# Patient Record
Sex: Female | Born: 1975 | Race: White | Hispanic: Yes | Marital: Married | Smoking: Never smoker
Health system: Southern US, Community
[De-identification: ages and names within clinical notes are randomized; demographics above are authoritative.]

## PROBLEM LIST (undated history)

## (undated) DIAGNOSIS — Z9889 Other specified postprocedural states: Secondary | ICD-10-CM

## (undated) DIAGNOSIS — T8859XA Other complications of anesthesia, initial encounter: Secondary | ICD-10-CM

## (undated) DIAGNOSIS — L988 Other specified disorders of the skin and subcutaneous tissue: Secondary | ICD-10-CM

## (undated) DIAGNOSIS — Z8619 Personal history of other infectious and parasitic diseases: Secondary | ICD-10-CM

## (undated) DIAGNOSIS — T4145XA Adverse effect of unspecified anesthetic, initial encounter: Secondary | ICD-10-CM

## (undated) DIAGNOSIS — R112 Nausea with vomiting, unspecified: Secondary | ICD-10-CM

## (undated) HISTORY — DX: Personal history of other infectious and parasitic diseases: Z86.19

## (undated) HISTORY — DX: Other complications of anesthesia, initial encounter: T88.59XA

## (undated) HISTORY — DX: Other specified disorders of the skin and subcutaneous tissue: L98.8

## (undated) HISTORY — DX: Other specified postprocedural states: R11.2

## (undated) HISTORY — DX: Other specified postprocedural states: Z98.890

## (undated) HISTORY — DX: Adverse effect of unspecified anesthetic, initial encounter: T41.45XA

## (undated) HISTORY — PX: RECTOPERITONEAL FISTULA CLOSURE: SHX2314

---

## 2004-10-14 ENCOUNTER — Other Ambulatory Visit: Admission: RE | Admit: 2004-10-14 | Discharge: 2004-10-14 | Payer: Self-pay | Admitting: Gynecology

## 2005-11-03 ENCOUNTER — Other Ambulatory Visit: Admission: RE | Admit: 2005-11-03 | Discharge: 2005-11-03 | Payer: Self-pay | Admitting: Gynecology

## 2006-10-29 HISTORY — PX: BARTHOLIN CYST MARSUPIALIZATION: SHX5383

## 2007-08-02 ENCOUNTER — Other Ambulatory Visit: Admission: RE | Admit: 2007-08-02 | Discharge: 2007-08-02 | Payer: Self-pay | Admitting: Gynecology

## 2008-08-21 ENCOUNTER — Encounter: Payer: Self-pay | Admitting: Women's Health

## 2008-08-21 ENCOUNTER — Other Ambulatory Visit: Admission: RE | Admit: 2008-08-21 | Discharge: 2008-08-21 | Payer: Self-pay | Admitting: Gynecology

## 2008-08-21 ENCOUNTER — Ambulatory Visit: Payer: Self-pay | Admitting: Women's Health

## 2011-07-21 ENCOUNTER — Encounter: Payer: Self-pay | Admitting: Women's Health

## 2011-07-21 ENCOUNTER — Other Ambulatory Visit (HOSPITAL_COMMUNITY)
Admission: RE | Admit: 2011-07-21 | Discharge: 2011-07-21 | Disposition: A | Discharge status: Home / Self Care | Payer: PRIVATE HEALTH INSURANCE | Source: Ambulatory Visit | Attending: Obstetrics and Gynecology | Admitting: Obstetrics and Gynecology

## 2011-07-21 ENCOUNTER — Ambulatory Visit (INDEPENDENT_AMBULATORY_CARE_PROVIDER_SITE_OTHER): Payer: PRIVATE HEALTH INSURANCE | Admitting: Women's Health

## 2011-07-21 VITALS — BP 126/72 | Ht 64.0 in | Wt 132.0 lb

## 2011-07-21 DIAGNOSIS — Z833 Family history of diabetes mellitus: Secondary | ICD-10-CM

## 2011-07-21 DIAGNOSIS — E079 Disorder of thyroid, unspecified: Secondary | ICD-10-CM

## 2011-07-21 DIAGNOSIS — Z01419 Encounter for gynecological examination (general) (routine) without abnormal findings: Secondary | ICD-10-CM

## 2011-07-21 LAB — CBC WITH DIFFERENTIAL/PLATELET
Basophils Relative: 0 % (ref 0–1)
Eosinophils Absolute: 0.2 10*3/uL (ref 0.0–0.7)
Eosinophils Relative: 2 % (ref 0–5)
Lymphs Abs: 2.3 10*3/uL (ref 0.7–4.0)
MCH: 28.5 pg (ref 26.0–34.0)
MCHC: 34.6 g/dL (ref 30.0–36.0)
MCV: 82.4 fL (ref 78.0–100.0)
Neutrophils Relative %: 68 % (ref 43–77)
Platelets: 297 10*3/uL (ref 150–400)
RDW: 13 % (ref 11.5–15.5)

## 2011-07-21 LAB — TSH: TSH: 0.838 u[IU]/mL (ref 0.350–4.500)

## 2011-07-21 NOTE — Patient Instructions (Signed)
Tdap vaccine Health Maintenance, Females A healthy lifestyle and preventative care can promote health and wellness.  Maintain regular health, dental, and eye exams.   Eat a healthy diet. Foods like vegetables, fruits, whole grains, low-fat dairy products, and lean protein foods contain the nutrients you need without too many calories. Decrease your intake of foods high in solid fats, added sugars, and salt. Get information about a proper diet from your caregiver, if necessary.   Regular physical exercise is one of the most important things you can do for your health. Most adults should get at least 150 minutes of moderate-intensity exercise (any activity that increases your heart rate and causes you to sweat) each week. In addition, most adults need muscle-strengthening exercises on 2 or more days a week.    Maintain a healthy weight. The body mass index (BMI) is a screening tool to identify possible weight problems. It provides an estimate of body fat based on height and weight. Your caregiver can help determine your BMI, and can help you achieve or maintain a healthy weight. For adults 20 years and older:   A BMI below 18.5 is considered underweight.   A BMI of 18.5 to 24.9 is normal.   A BMI of 25 to 29.9 is considered overweight.   A BMI of 30 and above is considered obese.   Maintain normal blood lipids and cholesterol by exercising and minimizing your intake of saturated fat. Eat a balanced diet with plenty of fruits and vegetables. Blood tests for lipids and cholesterol should begin at age 43 and be repeated every 5 years. If your lipid or cholesterol levels are high, you are over 50, or you are a high risk for heart disease, you may need your cholesterol levels checked more frequently.Ongoing high lipid and cholesterol levels should be treated with medicines if diet and exercise are not effective.   If you smoke, find out from your caregiver how to quit. If you do not use tobacco, do  not start.   If you are pregnant, do not drink alcohol. If you are breastfeeding, be very cautious about drinking alcohol. If you are not pregnant and choose to drink alcohol, do not exceed 1 drink per day. One drink is considered to be 12 ounces (355 mL) of beer, 5 ounces (148 mL) of wine, or 1.5 ounces (44 mL) of liquor.   Avoid use of street drugs. Do not share needles with anyone. Ask for help if you need support or instructions about stopping the use of drugs.   High blood pressure causes heart disease and increases the risk of stroke. Blood pressure should be checked at least every 1 to 2 years. Ongoing high blood pressure should be treated with medicines, if weight loss and exercise are not effective.   If you are 66 to 37 years old, ask your caregiver if you should take aspirin to prevent strokes.   Diabetes screening involves taking a blood sample to check your fasting blood sugar level. This should be done once every 3 years, after age 16, if you are within normal weight and without risk factors for diabetes. Testing should be considered at a younger age or be carried out more frequently if you are overweight and have at least 1 risk factor for diabetes.   Breast cancer screening is essential preventative care for women. You should practice "breast self-awareness." This means understanding the normal appearance and feel of your breasts and may include breast self-examination. Any changes detected, no  matter how small, should be reported to a caregiver. Women in their 20s and 30s should have a clinical breast exam (CBE) by a caregiver as part of a regular health exam every 1 to 3 years. After age 40, women should have a CBE every year. Starting at age 40, women should consider having a mammogram (breast X-ray) every year. Women who have a family history of breast cancer should talk to their caregiver about genetic screening. Women at a high risk of breast cancer should talk to their caregiver  about having an MRI and a mammogram every year.   The Pap test is a screening test for cervical cancer. Women should have a Pap test starting at age 21. Between ages 21 and 29, Pap tests should be repeated every 2 years. Beginning at age 30, you should have a Pap test every 3 years as long as the past 3 Pap tests have been normal. If you had a hysterectomy for a problem that was not cancer or a condition that could lead to cancer, then you no longer need Pap tests. If you are between ages 65 and 70, and you have had normal Pap tests going back 10 years, you no longer need Pap tests. If you have had past treatment for cervical cancer or a condition that could lead to cancer, you need Pap tests and screening for cancer for at least 20 years after your treatment. If Pap tests have been discontinued, risk factors (such as a new sexual partner) need to be reassessed to determine if screening should be resumed. Some women have medical problems that increase the chance of getting cervical cancer. In these cases, your caregiver may recommend more frequent screening and Pap tests.   The human papillomavirus (HPV) test is an additional test that may be used for cervical cancer screening. The HPV test looks for the virus that can cause the cell changes on the cervix. The cells collected during the Pap test can be tested for HPV. The HPV test could be used to screen women aged 30 years and older, and should be used in women of any age who have unclear Pap test results. After the age of 30, women should have HPV testing at the same frequency as a Pap test.   Colorectal cancer can be detected and often prevented. Most routine colorectal cancer screening begins at the age of 50 and continues through age 75. However, your caregiver may recommend screening at an earlier age if you have risk factors for colon cancer. On a yearly basis, your caregiver may provide home test kits to check for hidden blood in the stool. Use of a  small camera at the end of a tube, to directly examine the colon (sigmoidoscopy or colonoscopy), can detect the earliest forms of colorectal cancer. Talk to your caregiver about this at age 50, when routine screening begins. Direct examination of the colon should be repeated every 5 to 10 years through age 75, unless early forms of pre-cancerous polyps or small growths are found.   Hepatitis C blood testing is recommended for all people born from 1945 through 1965 and any individual with known risks for hepatitis C.   Practice safe sex. Use condoms and avoid high-risk sexual practices to reduce the spread of sexually transmitted infections (STIs). Sexually active women aged 25 and younger should be checked for Chlamydia, which is a common sexually transmitted infection. Older women with new or multiple partners should also be tested for Chlamydia. Testing   for other STIs is recommended if you are sexually active and at increased risk.   Osteoporosis is a disease in which the bones lose minerals and strength with aging. This can result in serious bone fractures. The risk of osteoporosis can be identified using a bone density scan. Women ages 99 and over and women at risk for fractures or osteoporosis should discuss screening with their caregivers. Ask your caregiver whether you should be taking a calcium supplement or vitamin D to reduce the rate of osteoporosis.   Menopause can be associated with physical symptoms and risks. Hormone replacement therapy is available to decrease symptoms and risks. You should talk to your caregiver about whether hormone replacement therapy is right for you.   Use sunscreen with a sun protection factor (SPF) of 30 or greater. Apply sunscreen liberally and repeatedly throughout the day. You should seek shade when your shadow is shorter than you. Protect yourself by wearing long sleeves, pants, a wide-brimmed hat, and sunglasses year round, whenever you are outdoors.   Notify  your caregiver of new moles or changes in moles, especially if there is a change in shape or color. Also notify your caregiver if a mole is larger than the size of a pencil eraser.   Stay current with your immunizations.  Document Released: 08/29/2010 Document Revised: 02/02/2011 Document Reviewed: 08/29/2010 [facility] Patient Information 2012 [place], [place].

## 2011-07-21 NOTE — Progress Notes (Signed)
Brandi Velez 1975-04-29 [id number]    History:    The patient presents for annual exam.  Monthly 3 day cycle. 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  Exam:  Filed Vitals:   07/21/11 1427  BP: 126/72    General appearance:  Normal Head/Neck:  Normal, without cervical or supraclavicular adenopathy. Thyroid:  Symmetrical, normal in size, without palpable masses or nodularity. Respiratory  Effort:  Normal  Auscultation:  Clear without wheezing or rhonchi Cardiovascular  Auscultation:  Regular rate, without rubs, murmurs or gallops  Edema/varicosities:  Not grossly evident Abdominal  Soft,nontender, without masses, guarding or rebound.  Liver/spleen:  No organomegaly noted  Hernia:  None appreciated  Skin  Inspection:  Grossly normal  Palpation:  Grossly normal Neurologic/psychiatric  Orientation:  Normal with appropriate conversation.  Mood/affect:  Normal  Genitourinary    Breasts: Examined lying and sitting.     Right: Without masses, retractions, discharge or axillary adenopathy.     Left: Without masses, retractions, discharge or axillary adenopathy.   Inguinal/mons:  Normal without inguinal adenopathy  External genitalia:  Scars well-healed, nontender to touch.   BUS/Urethra/Skene's glands:  Normal  Bladder:  Normal  Vagina:  Normal  Cervix:  Normal  Uterus:   normal in size, shape and contour.  Midline and mobile  Adnexa/parametria:     Rt: Without masses or tenderness.   Lt: Without masses or tenderness.  Anus and perineum: Normal  Digital rectal  exam: Normal sphincter tone without palpated masses or tenderness  Assessment/Plan:  36 y.o. MHFG0 for annual exam.     Dyspareunia  Plan: Will use Xylocaine 2% jelly at introitus, reviewed importance of relaxing and vaginal lubricants. Instructed to call if continues with pain. Will continue with condoms, states is not sure when she would like to conceive. SBE's, exercise, calcium rich diet, MVI daily. CBC, glucose, TSH, UA and Pap.     Harrington Challenger [facility], 4:01 PM 07/21/2011

## 2011-07-22 LAB — URINALYSIS W MICROSCOPIC + REFLEX CULTURE
Bacteria, UA: NONE SEEN
Bilirubin Urine: NEGATIVE
Casts: NONE SEEN
Crystals: NONE SEEN
Hgb urine dipstick: NEGATIVE
Ketones, ur: NEGATIVE mg/dL
Specific Gravity, Urine: 1.01 (ref 1.005–1.030)
Urobilinogen, UA: 0.2 mg/dL (ref 0.0–1.0)

## 2013-09-05 ENCOUNTER — Encounter: Payer: Self-pay | Admitting: Women's Health

## 2013-09-05 ENCOUNTER — Ambulatory Visit (INDEPENDENT_AMBULATORY_CARE_PROVIDER_SITE_OTHER): Payer: BC Managed Care – PPO | Admitting: Women's Health

## 2013-09-05 ENCOUNTER — Telehealth: Payer: Self-pay | Admitting: *Deleted

## 2013-09-05 VITALS — BP 112/68 | Ht 65.0 in | Wt 124.2 lb

## 2013-09-05 DIAGNOSIS — Z13220 Encounter for screening for lipoid disorders: Secondary | ICD-10-CM

## 2013-09-05 DIAGNOSIS — Z01419 Encounter for gynecological examination (general) (routine) without abnormal findings: Secondary | ICD-10-CM

## 2013-09-05 DIAGNOSIS — N63 Unspecified lump in unspecified breast: Secondary | ICD-10-CM

## 2013-09-05 LAB — CBC WITH DIFFERENTIAL/PLATELET
BASOS ABS: 0 10*3/uL (ref 0.0–0.1)
Basophils Relative: 1 % (ref 0–1)
EOS PCT: 5 % (ref 0–5)
Eosinophils Absolute: 0.2 10*3/uL (ref 0.0–0.7)
HEMATOCRIT: 40.7 % (ref 36.0–46.0)
HEMOGLOBIN: 13.7 g/dL (ref 12.0–15.0)
LYMPHS ABS: 1.6 10*3/uL (ref 0.7–4.0)
LYMPHS PCT: 44 % (ref 12–46)
MCH: 28.7 pg (ref 26.0–34.0)
MCHC: 33.7 g/dL (ref 30.0–36.0)
MCV: 85.3 fL (ref 78.0–100.0)
MONO ABS: 0.4 10*3/uL (ref 0.1–1.0)
MONOS PCT: 10 % (ref 3–12)
Neutro Abs: 1.4 10*3/uL — ABNORMAL LOW (ref 1.7–7.7)
Neutrophils Relative %: 40 % — ABNORMAL LOW (ref 43–77)
Platelets: 265 10*3/uL (ref 150–400)
RBC: 4.77 MIL/uL (ref 3.87–5.11)
RDW: 13.5 % (ref 11.5–15.5)
WBC: 3.6 10*3/uL — AB (ref 4.0–10.5)

## 2013-09-05 LAB — LIPID PANEL
Cholesterol: 156 mg/dL (ref 0–200)
HDL: 59 mg/dL (ref >39)
LDL CALC: 87 mg/dL (ref 0–99)
Total CHOL/HDL Ratio: 2.6 Ratio
Triglycerides: 50 mg/dL (ref <150)
VLDL: 10 mg/dL (ref 0–40)

## 2013-09-05 NOTE — Telephone Encounter (Signed)
Message copied by Aura CampsWEBB, JENNIFER L on Fri Sep 05, 2013  9:44 AM ------      Message from: Livingston WheelerOUNG, [place] J      Created: Fri Sep 05, 2013  9:13 AM       Needs diagnostic mammo/US left br outer quad at 3 1 cm mobile nodule for 2 months ------

## 2013-09-05 NOTE — Patient Instructions (Signed)

## 2013-09-05 NOTE — Telephone Encounter (Signed)
ORDER PLACED AT [facility] THEY WILL CONTACT PATIENT

## 2013-09-05 NOTE — Progress Notes (Signed)
Brandi Velez Apr 02, 1975 [id number]    History:    Presents for annual exam.  Regular monthly cycle/condoms. Normal Pap history. 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 Counseling for marital issues, no infidelity or abuse.  Past medical history, past surgical history, family history and social history were all reviewed and documented in the EPIC chart. Armed forces operational officerDental hygienist, from [place].  ROS:  A  12 point ROS was performed and pertinent positives and negatives are included.  Exam:  Filed Vitals:   09/05/13 0855  BP: 112/68    General appearance:  Normal Thyroid:  Symmetrical, normal in size, without palpable masses or nodularity. Respiratory  Auscultation:  Clear without wheezing or rhonchi Cardiovascular  Auscultation:  Regular rate, without rubs, murmurs or gallops  Edema/varicosities:  Not grossly evident Abdominal  Soft,nontender, without masses, guarding or rebound.  Liver/spleen:  No organomegaly noted  Hernia:  None appreciated  Skin  Inspection:  Grossly normal   Breasts: Examined lying and sitting.     Right: Without masses, retractions, discharge or axillary adenopathy.     Left: 1 cm mobile nontender nodule outer quadrant 3:00 position Gentitourinary   Inguinal/mons:  Normal without inguinal adenopathy  External genitalia:  Normal  BUS/Urethra/Skene's glands:  Normal  Vagina:  Normal  Cervix:  Normal  Uterus:  normal in size, shape and contour.  Midline and mobile  Adnexa/parametria:     Rt: Without masses or tenderness.   Lt: Without masses or tenderness.  Anus and perineum: Normal  Digital rectal exam: Normal sphincter tone without palpated masses or tenderness  Assessment/Plan:  38 y.o. MHF G0 for annual exam.    Monthly 3 day cycle/condoms 1 cm mobile nontender nodule left outer quadrant breast at 3:00  Plan: Diagnostic mammogram with ultrasound left breast, we'll get scheduled. SBE's, continue regular exercise,  calcium rich diet, MVI daily encouraged. Continue counseling for marital issues. CBC, lipid panel, UA, Pap normal 2013, new screening guidelines reviewed.   Note: This dictation was prepared with Dragon/digital dictation.  Any transcriptional errors that result are unintentional. Harrington ChallengerYOUNG,NANCY J [facility] CenterWHNP, 9:24 AM 09/05/2013

## 2013-09-06 LAB — URINALYSIS W MICROSCOPIC + REFLEX CULTURE
BILIRUBIN URINE: NEGATIVE
CASTS: NONE SEEN
CRYSTALS: NONE SEEN
GLUCOSE, UA: NEGATIVE mg/dL
Hgb urine dipstick: NEGATIVE
KETONES UR: NEGATIVE mg/dL
Leukocytes, UA: NEGATIVE
Nitrite: NEGATIVE
PH: 6.5 (ref 5.0–8.0)
Protein, ur: NEGATIVE mg/dL
SPECIFIC GRAVITY, URINE: 1.023 (ref 1.005–1.030)
SQUAMOUS EPITHELIAL / LPF: NONE SEEN
Urobilinogen, UA: 0.2 mg/dL (ref 0.0–1.0)

## 2013-09-12 ENCOUNTER — Telehealth: Payer: Self-pay | Admitting: Women's Health

## 2013-09-12 NOTE — Telephone Encounter (Signed)
09/12/13-Pt called today and had not heard from [facility] about scheduling a diagnostic mammogram. Per Victorino DikeJennifer I gave the pt the phone number to [phone]) so pt could call them direct as Victorino DikeJennifer had sent information to them on 09/05/13.Pt will call there to make appt/[facility]

## 2013-09-12 NOTE — Telephone Encounter (Signed)
appointment 09/18/13 @ 7:00

## 2013-09-18 ENCOUNTER — Other Ambulatory Visit: Payer: Self-pay | Admitting: Women's Health

## 2013-09-18 ENCOUNTER — Ambulatory Visit
Admission: RE | Admit: 2013-09-18 | Discharge: 2013-09-18 | Disposition: A | Discharge status: Home / Self Care | Payer: BC Managed Care – PPO | Source: Ambulatory Visit | Attending: Women's Health | Admitting: Women's Health

## 2013-09-18 DIAGNOSIS — N63 Unspecified lump in unspecified breast: Secondary | ICD-10-CM

## 2015-01-07 ENCOUNTER — Encounter: Payer: Self-pay | Admitting: Women's Health

## 2015-01-07 ENCOUNTER — Ambulatory Visit (INDEPENDENT_AMBULATORY_CARE_PROVIDER_SITE_OTHER): Payer: BLUE CROSS/BLUE SHIELD | Admitting: Women's Health

## 2015-01-07 ENCOUNTER — Other Ambulatory Visit (HOSPITAL_COMMUNITY)
Admission: RE | Admit: 2015-01-07 | Discharge: 2015-01-07 | Disposition: A | Discharge status: Home / Self Care | Payer: BLUE CROSS/BLUE SHIELD | Source: Ambulatory Visit | Attending: Women's Health | Admitting: Women's Health

## 2015-01-07 VITALS — BP 120/78 | Ht 65.0 in | Wt 125.0 lb

## 2015-01-07 DIAGNOSIS — Z1151 Encounter for screening for human papillomavirus (HPV): Secondary | ICD-10-CM | POA: Insufficient documentation

## 2015-01-07 DIAGNOSIS — Z01419 Encounter for gynecological examination (general) (routine) without abnormal findings: Secondary | ICD-10-CM | POA: Insufficient documentation

## 2015-01-07 LAB — CBC WITH DIFFERENTIAL/PLATELET
Basophils Absolute: 0.1 10*3/uL (ref 0.0–0.1)
Basophils Relative: 1 % (ref 0–1)
EOS PCT: 4 % (ref 0–5)
Eosinophils Absolute: 0.2 10*3/uL (ref 0.0–0.7)
HEMATOCRIT: 40.1 % (ref 36.0–46.0)
Hemoglobin: 13.6 g/dL (ref 12.0–15.0)
LYMPHS ABS: 2.2 10*3/uL (ref 0.7–4.0)
Lymphocytes Relative: 42 % (ref 12–46)
MCH: 29 pg (ref 26.0–34.0)
MCHC: 33.9 g/dL (ref 30.0–36.0)
MCV: 85.5 fL (ref 78.0–100.0)
MONOS PCT: 7 % (ref 3–12)
MPV: 9.6 fL (ref 8.6–12.4)
Monocytes Absolute: 0.4 10*3/uL (ref 0.1–1.0)
Neutro Abs: 2.4 10*3/uL (ref 1.7–7.7)
Neutrophils Relative %: 46 % (ref 43–77)
Platelets: 263 10*3/uL (ref 150–400)
RBC: 4.69 MIL/uL (ref 3.87–5.11)
RDW: 13.4 % (ref 11.5–15.5)
WBC: 5.2 10*3/uL (ref 4.0–10.5)

## 2015-01-07 NOTE — Patient Instructions (Signed)
Health Maintenance, Female Adopting a healthy lifestyle and getting preventive care can go a long way to promote health and wellness. Talk with your health care provider about what schedule of regular examinations is right for you. This is a good chance for you to check in with your provider about disease prevention and staying healthy. In between checkups, there are plenty of things you can do on your own. Experts have done a lot of research about which lifestyle changes and preventive measures are most likely to keep you healthy. Ask your health care provider for more information. WEIGHT AND DIET  Eat a healthy diet  Be sure to include plenty of vegetables, fruits, low-fat dairy products, and lean protein.  Do not eat a lot of foods high in solid fats, added sugars, or salt.  Get regular exercise. This is one of the most important things you can do for your health.  Most adults should exercise for at least 150 minutes each week. The exercise should increase your heart rate and make you sweat (moderate-intensity exercise).  Most adults should also do strengthening exercises at least twice a week. This is in addition to the moderate-intensity exercise.  Maintain a healthy weight  Body mass index (BMI) is a measurement that can be used to identify possible weight problems. It estimates body fat based on height and weight. Your health care provider can help determine your BMI and help you achieve or maintain a healthy weight.  For females 20 years of age and older:   A BMI below 18.5 is considered underweight.  A BMI of 18.5 to 24.9 is normal.  A BMI of 25 to 29.9 is considered overweight.  A BMI of 30 and above is considered obese.  Watch levels of cholesterol and blood lipids  You should start having your blood tested for lipids and cholesterol at 40 years of age, then have this test every 5 years.  You may need to have your cholesterol levels checked more often if:  Your lipid  or cholesterol levels are high.  You are older than 40 years of age.  You are at high risk for heart disease.  CANCER SCREENING   Lung Cancer  Lung cancer screening is recommended for adults 55-80 years old who are at high risk for lung cancer because of a history of smoking.  A yearly low-dose CT scan of the lungs is recommended for people who:  Currently smoke.  Have quit within the past 15 years.  Have at least a 30-pack-year history of smoking. A pack year is smoking an average of one pack of cigarettes a day for 1 year.  Yearly screening should continue until it has been 15 years since you quit.  Yearly screening should stop if you develop a health problem that would prevent you from having lung cancer treatment.  Breast Cancer  Practice breast self-awareness. This means understanding how your breasts normally appear and feel.  It also means doing regular breast self-exams. Let your health care provider know about any changes, no matter how small.  If you are in your 20s or 30s, you should have a clinical breast exam (CBE) by a health care provider every 1-3 years as part of a regular health exam.  If you are 40 or older, have a CBE every year. Also consider having a breast X-ray (mammogram) every year.  If you have a family history of breast cancer, talk to your health care provider about genetic screening.  If you   are at high risk for breast cancer, talk to your health care provider about having an MRI and a mammogram every year.  Breast cancer gene (BRCA) assessment is recommended for women who have family members with BRCA-related cancers. BRCA-related cancers include:  Breast.  Ovarian.  Tubal.  Peritoneal cancers.  Results of the assessment will determine the need for genetic counseling and BRCA1 and BRCA2 testing. Cervical Cancer Your health care provider may recommend that you be screened regularly for cancer of the pelvic organs (ovaries, uterus, and  vagina). This screening involves a pelvic examination, including checking for microscopic changes to the surface of your cervix (Pap test). You may be encouraged to have this screening done every 3 years, beginning at age 21.  For women ages 30-65, health care providers may recommend pelvic exams and Pap testing every 3 years, or they may recommend the Pap and pelvic exam, combined with testing for human papilloma virus (HPV), every 5 years. Some types of HPV increase your risk of cervical cancer. Testing for HPV may also be done on women of any age with unclear Pap test results.  Other health care providers may not recommend any screening for nonpregnant women who are considered low risk for pelvic cancer and who do not have symptoms. Ask your health care provider if a screening pelvic exam is right for you.  If you have had past treatment for cervical cancer or a condition that could lead to cancer, you need Pap tests and screening for cancer for at least 20 years after your treatment. If Pap tests have been discontinued, your risk factors (such as having a new sexual partner) need to be reassessed to determine if screening should resume. Some women have medical problems that increase the chance of getting cervical cancer. In these cases, your health care provider may recommend more frequent screening and Pap tests. Colorectal Cancer  This type of cancer can be detected and often prevented.  Routine colorectal cancer screening usually begins at 40 years of age and continues through 40 years of age.  Your health care provider may recommend screening at an earlier age if you have risk factors for colon cancer.  Your health care provider may also recommend using home test kits to check for hidden blood in the stool.  A small camera at the end of a tube can be used to examine your colon directly (sigmoidoscopy or colonoscopy). This is done to check for the earliest forms of colorectal  cancer.  Routine screening usually begins at age 50.  Direct examination of the colon should be repeated every 5-10 years through 40 years of age. However, you may need to be screened more often if early forms of precancerous polyps or small growths are found. Skin Cancer  Check your skin from head to toe regularly.  Tell your health care provider about any new moles or changes in moles, especially if there is a change in a mole's shape or color.  Also tell your health care provider if you have a mole that is larger than the size of a pencil eraser.  Always use sunscreen. Apply sunscreen liberally and repeatedly throughout the day.  Protect yourself by wearing long sleeves, pants, a wide-brimmed hat, and sunglasses whenever you are outside. HEART DISEASE, DIABETES, AND HIGH BLOOD PRESSURE   High blood pressure causes heart disease and increases the risk of stroke. High blood pressure is more likely to develop in:  People who have blood pressure in the high end   of the normal range (130-139/85-89 mm Hg).  People who are overweight or obese.  People who are African American.  If you are 38-23 years of age, have your blood pressure checked every 3-5 years. If you are 61 years of age or older, have your blood pressure checked every year. You should have your blood pressure measured twice--once when you are at a hospital or clinic, and once when you are not at a hospital or clinic. Record the average of the two measurements. To check your blood pressure when you are not at a hospital or clinic, you can use:  An automated blood pressure machine at a pharmacy.  A home blood pressure monitor.  If you are between 45 years and 39 years old, ask your health care provider if you should take aspirin to prevent strokes.  Have regular diabetes screenings. This involves taking a blood sample to check your fasting blood sugar level.  If you are at a normal weight and have a low risk for diabetes,  have this test once every three years after 40 years of age.  If you are overweight and have a high risk for diabetes, consider being tested at a younger age or more often. PREVENTING INFECTION  Hepatitis B  If you have a higher risk for hepatitis B, you should be screened for this virus. You are considered at high risk for hepatitis B if:  You were born in a country where hepatitis B is common. Ask your health care provider which countries are considered high risk.  Your parents were born in a high-risk country, and you have not been immunized against hepatitis B (hepatitis B vaccine).  You have HIV or AIDS.  You use needles to inject street drugs.  You live with someone who has hepatitis B.  You have had sex with someone who has hepatitis B.  You get hemodialysis treatment.  You take certain medicines for conditions, including cancer, organ transplantation, and autoimmune conditions. Hepatitis C  Blood testing is recommended for:  Everyone born from [birth year] through 1966.  Anyone with known risk factors for hepatitis C. Sexually transmitted infections (STIs)  You should be screened for sexually transmitted infections (STIs) including gonorrhea and chlamydia if:  You are sexually active and are younger than 40 years of age.  You are older than 40 years of age and your health care provider tells you that you are at risk for this type of infection.  Your sexual activity has changed since you were last screened and you are at an increased risk for chlamydia or gonorrhea. Ask your health care provider if you are at risk.  If you do not have HIV, but are at risk, it may be recommended that you take a prescription medicine daily to prevent HIV infection. This is called pre-exposure prophylaxis (PrEP). You are considered at risk if:  You are sexually active and do not regularly use condoms or know the HIV status of your partner(s).  You take drugs by injection.  You are sexually  active with a partner who has HIV. Talk with your health care provider about whether you are at high risk of being infected with HIV. If you choose to begin PrEP, you should first be tested for HIV. You should then be tested every 3 months for as long as you are taking PrEP.  PREGNANCY   If you are premenopausal and you may become pregnant, ask your health care provider about preconception counseling.  If you may  become pregnant, take 400 to 800 micrograms (mcg) of folic acid every day.  If you want to prevent pregnancy, talk to your health care provider about birth control (contraception). OSTEOPOROSIS AND MENOPAUSE   Osteoporosis is a disease in which the bones lose minerals and strength with aging. This can result in serious bone fractures. Your risk for osteoporosis can be identified using a bone density scan.  If you are 61 years of age or older, or if you are at risk for osteoporosis and fractures, ask your health care provider if you should be screened.  Ask your health care provider whether you should take a calcium or vitamin D supplement to lower your risk for osteoporosis.  Menopause may have certain physical symptoms and risks.  Hormone replacement therapy may reduce some of these symptoms and risks. Talk to your health care provider about whether hormone replacement therapy is right for you.  HOME CARE INSTRUCTIONS   Schedule regular health, dental, and eye exams.  Stay current with your immunizations.   Do not use any tobacco products including cigarettes, chewing tobacco, or electronic cigarettes.  If you are pregnant, do not drink alcohol.  If you are breastfeeding, limit how much and how often you drink alcohol.  Limit alcohol intake to no more than 1 drink per day for nonpregnant women. One drink equals 12 ounces of beer, 5 ounces of wine, or 1 ounces of hard liquor.  Do not use street drugs.  Do not share needles.  Ask your health care provider for help if  you need support or information about quitting drugs.  Tell your health care provider if you often feel depressed.  Tell your health care provider if you have ever been abused or do not feel safe at home.   This information is not intended to replace advice given to you by your health care provider. Make sure you discuss any questions you have with your health care provider.   Document Released: 08/29/2010 Document Revised: 03/06/2014 Document Reviewed: 01/15/2013 Elsevier Interactive Patient Education [employer].

## 2015-01-07 NOTE — Progress Notes (Signed)
Brandi Velez 1975-09-16 [id number]    History:    Presents for annual exam.  Regular monthly cycle. Normal Pap history and baseline mammogram. Infertility, husband ED. In marital counseling.  Past medical history, past surgical history, family history and social history were all reviewed and documented in the EPIC chart. 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 Father heart disease, mother alive and well. 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  ROS:  A ROS was performed and pertinent positives and negatives are included.  Exam:  Filed Vitals:   01/07/15 0858  BP: 120/78    General appearance:  Normal Thyroid:  Symmetrical, normal in size, without palpable masses or nodularity. Respiratory  Auscultation:  Clear without wheezing or rhonchi Cardiovascular  Auscultation:  Regular rate, without rubs, murmurs or gallops  Edema/varicosities:  Not grossly evident Abdominal  Soft,nontender, without masses, guarding or rebound.  Liver/spleen:  No organomegaly noted  Hernia:  None appreciated  Skin  Inspection:  Grossly normal   Breasts: Examined lying and sitting.     Right: Without masses, retractions, discharge or axillary adenopathy.     Left: Without masses, retractions, discharge or axillary adenopathy. Gentitourinary   Inguinal/mons:  Normal without inguinal adenopathy  External genitalia:  Normal  BUS/Urethra/Skene's glands:  Normal  Vagina:  Normal  Cervix:  Normal  Uterus:   normal in size, shape and contour.  Midline and mobile  Adnexa/parametria:     Rt: Without masses or tenderness.   Lt: Without masses or tenderness.  Anus and perineum: Normal  Digital rectal exam: Normal sphincter tone without palpated masses or tenderness  Assessment/Plan:  39 y.o. MWF G0  for annual exam.     Regular monthly cycles-desires conception Husband erectile dysfunction  Plan: Semen analysis, urology consult. Declines fertility testing until after  husband assessed. Return to office with missed cycle for viability ultrasound. SBE's, annual screening mammogram at 40, exercise, healthy pregnancy behaviors reviewed, calcium rich diet, MVI daily encouraged. Ovulation prediction reviewed, increased frequency of intercourse encouraged. CBC, UA, Pap with HR HPV typing, new screening guidelines reviewed.   Harrington ChallengerYOUNG,NANCY J [facility] LLCWHNP, 9:34 AM 01/07/2015

## 2015-01-08 LAB — URINALYSIS W MICROSCOPIC + REFLEX CULTURE
Bacteria, UA: NONE SEEN [HPF]
Bilirubin Urine: NEGATIVE
CASTS: NONE SEEN [LPF]
CRYSTALS: NONE SEEN [HPF]
Glucose, UA: NEGATIVE
HGB URINE DIPSTICK: NEGATIVE
Leukocytes, UA: NEGATIVE
Nitrite: NEGATIVE
Protein, ur: NEGATIVE
SQUAMOUS EPITHELIAL / LPF: NONE SEEN [HPF] (ref <=5)
Specific Gravity, Urine: 1.019 (ref 1.001–1.035)
WBC, UA: NONE SEEN WBC/HPF (ref <=5)
Yeast: NONE SEEN [HPF]
pH: 7 (ref 5.0–8.0)

## 2015-01-10 LAB — URINE CULTURE: Colony Count: 85000

## 2015-01-11 LAB — CYTOLOGY - PAP

## 2015-12-23 ENCOUNTER — Telehealth: Payer: Self-pay | Admitting: *Deleted

## 2015-12-23 NOTE — Telephone Encounter (Signed)
Pt called to get advice, LMP:11/25/15, had IUI on 12/06/15 states she has had cramping x 2 weeks now, negative UPT on 12/20/15. C/o cramping, dizziness thirsty and lightheaded as times and notes brown to red blood off and on since yesterday when wiping only. Pt would like your recommendations? Office visit? Please advise

## 2015-12-24 NOTE — Telephone Encounter (Signed)
TC cycle started today, husband has low sperm count, has F/U sched with urologist.

## 2016-01-14 ENCOUNTER — Encounter: Payer: Self-pay | Admitting: Women's Health

## 2016-01-14 ENCOUNTER — Ambulatory Visit (INDEPENDENT_AMBULATORY_CARE_PROVIDER_SITE_OTHER): Payer: BLUE CROSS/BLUE SHIELD | Admitting: Women's Health

## 2016-01-14 VITALS — BP 124/80 | Ht 65.0 in | Wt 132.0 lb

## 2016-01-14 DIAGNOSIS — N926 Irregular menstruation, unspecified: Secondary | ICD-10-CM

## 2016-01-14 DIAGNOSIS — Z01419 Encounter for gynecological examination (general) (routine) without abnormal findings: Secondary | ICD-10-CM

## 2016-01-14 LAB — CBC WITH DIFFERENTIAL/PLATELET
BASOS PCT: 1 %
Basophils Absolute: 47 cells/uL (ref 0–200)
EOS ABS: 188 {cells}/uL (ref 15–500)
EOS PCT: 4 %
HCT: 41.3 % (ref 35.0–45.0)
Hemoglobin: 13.3 g/dL (ref 11.7–15.5)
LYMPHS PCT: 41 %
Lymphs Abs: 1927 cells/uL (ref 850–3900)
MCH: 28.5 pg (ref 27.0–33.0)
MCHC: 32.2 g/dL (ref 32.0–36.0)
MCV: 88.6 fL (ref 80.0–100.0)
MONOS PCT: 13 %
MPV: 10 fL (ref 7.5–12.5)
Monocytes Absolute: 611 cells/uL (ref 200–950)
Neutro Abs: 1927 cells/uL (ref 1500–7800)
Neutrophils Relative %: 41 %
PLATELETS: 260 10*3/uL (ref 140–400)
RBC: 4.66 MIL/uL (ref 3.80–5.10)
RDW: 12.8 % (ref 11.0–15.0)
WBC: 4.7 10*3/uL (ref 3.8–10.8)

## 2016-01-14 LAB — COMPREHENSIVE METABOLIC PANEL
ALT: 18 U/L (ref 6–29)
AST: 18 U/L (ref 10–30)
Albumin: 4 g/dL (ref 3.6–5.1)
Alkaline Phosphatase: 62 U/L (ref 33–115)
BUN: 19 mg/dL (ref 7–25)
CHLORIDE: 106 mmol/L (ref 98–110)
CO2: 22 mmol/L (ref 20–31)
Calcium: 8.9 mg/dL (ref 8.6–10.2)
Creat: 0.6 mg/dL (ref 0.50–1.10)
GLUCOSE: 97 mg/dL (ref 65–99)
POTASSIUM: 3.7 mmol/L (ref 3.5–5.3)
Sodium: 136 mmol/L (ref 135–146)
Total Bilirubin: 0.5 mg/dL (ref 0.2–1.2)
Total Protein: 6.7 g/dL (ref 6.1–8.1)

## 2016-01-14 LAB — URINALYSIS W MICROSCOPIC + REFLEX CULTURE
BACTERIA UA: NONE SEEN [HPF]
Bilirubin Urine: NEGATIVE
CASTS: NONE SEEN [LPF]
CRYSTALS: NONE SEEN [HPF]
GLUCOSE, UA: NEGATIVE
HGB URINE DIPSTICK: NEGATIVE
Ketones, ur: NEGATIVE
LEUKOCYTES UA: NEGATIVE
Nitrite: NEGATIVE
PROTEIN: NEGATIVE
SQUAMOUS EPITHELIAL / LPF: NONE SEEN [HPF] (ref <=5)
Specific Gravity, Urine: 1.014 (ref 1.001–1.035)
WBC, UA: NONE SEEN WBC/HPF (ref <=5)
YEAST: NONE SEEN [HPF]
pH: 7.5 (ref 5.0–8.0)

## 2016-01-14 NOTE — Progress Notes (Signed)
Brandi Velez Apr 29, 1975 [id number]  History:    Presents for annual exam.  Monthly cycle history of infertility, ED with ejaculatory defect currently working with Dr. Elesa Hackereaton having  IUI's. Has had  normal TSH, prolactin, progestin on day 8. Currently on day 24 of cycle. Normal Pap history.  Past medical history, past surgical history, family history and social history were all reviewed and documented in the EPIC chart. 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 2008. perineal abscess with fistula unknown cause.  ROS:  A ROS was performed and pertinent positives and negatives are included.  Exam:  Vitals:   01/14/16 0915  BP: 124/80  Weight: 132 lb (59.9 kg)  Height: 5\' 5"  (1.651 m)   Body mass index is 21.97 kg/m.   General appearance:  Normal Thyroid:  Symmetrical, normal in size, without palpable masses or nodularity. Respiratory  Auscultation:  Clear without wheezing or rhonchi Cardiovascular  Auscultation:  Regular rate, without rubs, murmurs or gallops  Edema/varicosities:  Not grossly evident Abdominal  Soft,nontender, without masses, guarding or rebound.  Liver/spleen:  No organomegaly noted  Hernia:  None appreciated  Skin  Inspection:  Grossly normal   Breasts: Examined lying and sitting.     Right: Without masses, retractions, discharge or axillary adenopathy.     Left: Without masses, retractions, discharge or axillary adenopathy. Gentitourinary   Inguinal/mons:  Normal without inguinal adenopathy  External genitalia:  Normal, Well-healed abscess scar  BUS/Urethra/Skene's glands:  Normal  Vagina:  Normal  Cervix:  Normal  Uterus:  normal in size, shape and contour.  Midline and mobile  Adnexa/parametria:     Rt: Without masses or tenderness.   Lt: Without masses or tenderness.  Anus and perineum: Normal  Digital rectal exam: Normal sphincter tone without palpated masses or tenderness  Assessment/Plan:  40 y.o. MHF G0 for annual  exam.     Monthly cycle desiring conception working with infertility specialist  Plan: Schedule annual screening mammogram when on cycle (to be sure not pregnant), continue SBE's. Continue healthy lifestyle of exercise, healthy diet and prenatal vitamin daily encouraged. Aware of healthy pregnancy behaviors. CBC, progesterone, UA, Pap normal with negative HR HPV 2016, new screening guidelines reviewed.   Harrington ChallengerYOUNG,Brandi Velez J WHNP, 12:12 PM 01/14/2016

## 2016-01-14 NOTE — Patient Instructions (Signed)
Day 3-5 blood work, estradiol and [facility]  Health Maintenance, Female Introduction Adopting a healthy lifestyle and getting preventive care can go a long way to promote health and wellness. Talk with your health care provider about what schedule of regular examinations is right for you. This is a good chance for you to check in with your provider about disease prevention and staying healthy. In between checkups, there are plenty of things you can do on your own. Experts have done a lot of research about which lifestyle changes and preventive measures are most likely to keep you healthy. Ask your health care provider for more information. Weight and diet Eat a healthy diet  Be sure to include plenty of vegetables, fruits, low-fat dairy products, and lean protein.  Do not eat a lot of foods high in solid fats, added sugars, or salt.  Get regular exercise. This is one of the most important things you can do for your health.  Most adults should exercise for at least 150 minutes each week. The exercise should increase your heart rate and make you sweat (moderate-intensity exercise).  Most adults should also do strengthening exercises at least twice a week. This is in addition to the moderate-intensity exercise. Maintain a healthy weight  Body mass index (BMI) is a measurement that can be used to identify possible weight problems. It estimates body fat based on height and weight. Your health care provider can help determine your BMI and help you achieve or maintain a healthy weight.  For females 15 years of age and older:  A BMI below 18.5 is considered underweight.  A BMI of 18.5 to 24.9 is normal.  A BMI of 25 to 29.9 is considered overweight.  A BMI of 30 and above is considered obese. Watch levels of cholesterol and blood lipids  You should start having your blood tested for lipids and cholesterol at 41 years of age, then have this test every 5 years.  You may need to have your  cholesterol levels checked more often if:  Your lipid or cholesterol levels are high.  You are older than 41 years of age.  You are at high risk for heart disease. Cancer screening Lung Cancer  Lung cancer screening is recommended for adults 78-30 years old who are at high risk for lung cancer because of a history of smoking.  A yearly low-dose CT scan of the lungs is recommended for people who:  Currently smoke.  Have quit within the past 15 years.  Have at least a 30-pack-year history of smoking. A pack year is smoking an average of one pack of cigarettes a day for 1 year.  Yearly screening should continue until it has been 15 years since you quit.  Yearly screening should stop if you develop a health problem that would prevent you from having lung cancer treatment. Breast Cancer  Practice breast self-awareness. This means understanding how your breasts normally appear and feel.  It also means doing regular breast self-exams. Let your health care provider know about any changes, no matter how small.  If you are in your 20s or 30s, you should have a clinical breast exam (CBE) by a health care provider every 1-3 years as part of a regular health exam.  If you are 64 or older, have a CBE every year. Also consider having a breast X-ray (mammogram) every year.  If you have a family history of breast cancer, talk to your health care provider about genetic screening.  If  you are at high risk for breast cancer, talk to your health care provider about having an MRI and a mammogram every year.  Breast cancer gene (BRCA) assessment is recommended for women who have family members with BRCA-related cancers. BRCA-related cancers include:  Breast.  Ovarian.  Tubal.  Peritoneal cancers.  Results of the assessment will determine the need for genetic counseling and BRCA1 and BRCA2 testing. Cervical Cancer  Your health care provider may recommend that you be screened regularly for  cancer of the pelvic organs (ovaries, uterus, and vagina). This screening involves a pelvic examination, including checking for microscopic changes to the surface of your cervix (Pap test). You may be encouraged to have this screening done every 3 years, beginning at age 72.  For women ages 38-65, health care providers may recommend pelvic exams and Pap testing every 3 years, or they may recommend the Pap and pelvic exam, combined with testing for human papilloma virus (HPV), every 5 years. Some types of HPV increase your risk of cervical cancer. Testing for HPV may also be done on women of any age with unclear Pap test results.  Other health care providers may not recommend any screening for nonpregnant women who are considered low risk for pelvic cancer and who do not have symptoms. Ask your health care provider if a screening pelvic exam is right for you.  If you have had past treatment for cervical cancer or a condition that could lead to cancer, you need Pap tests and screening for cancer for at least 20 years after your treatment. If Pap tests have been discontinued, your risk factors (such as having a new sexual partner) need to be reassessed to determine if screening should resume. Some women have medical problems that increase the chance of getting cervical cancer. In these cases, your health care provider may recommend more frequent screening and Pap tests. Colorectal Cancer  This type of cancer can be detected and often prevented.  Routine colorectal cancer screening usually begins at 41 years of age and continues through 41 years of age.  Your health care provider may recommend screening at an earlier age if you have risk factors for colon cancer.  Your health care provider may also recommend using home test kits to check for hidden blood in the stool.  A small camera at the end of a tube can be used to examine your colon directly (sigmoidoscopy or colonoscopy). This is done to check for  the earliest forms of colorectal cancer.  Routine screening usually begins at age 3.  Direct examination of the colon should be repeated every 5-10 years through 41 years of age. However, you may need to be screened more often if early forms of precancerous polyps or small growths are found. Skin Cancer  Check your skin from head to toe regularly.  Tell your health care provider about any new moles or changes in moles, especially if there is a change in a mole's shape or color.  Also tell your health care provider if you have a mole that is larger than the size of a pencil eraser.  Always use sunscreen. Apply sunscreen liberally and repeatedly throughout the day.  Protect yourself by wearing long sleeves, pants, a wide-brimmed hat, and sunglasses whenever you are outside. Heart disease, diabetes, and high blood pressure  High blood pressure causes heart disease and increases the risk of stroke. High blood pressure is more likely to develop in:  People who have blood pressure in the high  end of the normal range (130-139/85-89 mm Hg).  People who are overweight or obese.  People who are African American.  If you are 63-43 years of age, have your blood pressure checked every 3-5 years. If you are 65 years of age or older, have your blood pressure checked every year. You should have your blood pressure measured twice-once when you are at a hospital or clinic, and once when you are not at a hospital or clinic. Record the average of the two measurements. To check your blood pressure when you are not at a hospital or clinic, you can use:  An automated blood pressure machine at a pharmacy.  A home blood pressure monitor.  If you are between 19 years and 71 years old, ask your health care provider if you should take aspirin to prevent strokes.  Have regular diabetes screenings. This involves taking a blood sample to check your fasting blood sugar level.  If you are at a normal weight and  have a low risk for diabetes, have this test once every three years after 42 years of age.  If you are overweight and have a high risk for diabetes, consider being tested at a younger age or more often. Preventing infection Hepatitis B  If you have a higher risk for hepatitis B, you should be screened for this virus. You are considered at high risk for hepatitis B if:  You were born in a country where hepatitis B is common. Ask your health care provider which countries are considered high risk.  Your parents were born in a high-risk country, and you have not been immunized against hepatitis B (hepatitis B vaccine).  You have HIV or AIDS.  You use needles to inject street drugs.  You live with someone who has hepatitis B.  You have had sex with someone who has hepatitis B.  You get hemodialysis treatment.  You take certain medicines for conditions, including cancer, organ transplantation, and autoimmune conditions. Hepatitis C  Blood testing is recommended for:  Everyone born from [birth year] through 1966.  Anyone with known risk factors for hepatitis C. Sexually transmitted infections (STIs)  You should be screened for sexually transmitted infections (STIs) including gonorrhea and chlamydia if:  You are sexually active and are younger than 41 years of age.  You are older than 41 years of age and your health care provider tells you that you are at risk for this type of infection.  Your sexual activity has changed since you were last screened and you are at an increased risk for chlamydia or gonorrhea. Ask your health care provider if you are at risk.  If you do not have HIV, but are at risk, it may be recommended that you take a prescription medicine daily to prevent HIV infection. This is called pre-exposure prophylaxis (PrEP). You are considered at risk if:  You are sexually active and do not regularly use condoms or know the HIV status of your partner(s).  You take drugs by  injection.  You are sexually active with a partner who has HIV. Talk with your health care provider about whether you are at high risk of being infected with HIV. If you choose to begin PrEP, you should first be tested for HIV. You should then be tested every 3 months for as long as you are taking PrEP. Pregnancy  If you are premenopausal and you may become pregnant, ask your health care provider about preconception counseling.  If you may become pregnant,  take 400 to 800 micrograms (mcg) of folic acid every day.  If you want to prevent pregnancy, talk to your health care provider about birth control (contraception). Osteoporosis and menopause  Osteoporosis is a disease in which the bones lose minerals and strength with aging. This can result in serious bone fractures. Your risk for osteoporosis can be identified using a bone density scan.  If you are 63 years of age or older, or if you are at risk for osteoporosis and fractures, ask your health care provider if you should be screened.  Ask your health care provider whether you should take a calcium or vitamin D supplement to lower your risk for osteoporosis.  Menopause may have certain physical symptoms and risks.  Hormone replacement therapy may reduce some of these symptoms and risks. Talk to your health care provider about whether hormone replacement therapy is right for you. Follow these instructions at home:  Schedule regular health, dental, and eye exams.  Stay current with your immunizations.  Do not use any tobacco products including cigarettes, chewing tobacco, or electronic cigarettes.  If you are pregnant, do not drink alcohol.  If you are breastfeeding, limit how much and how often you drink alcohol.  Limit alcohol intake to no more than 1 drink per day for nonpregnant women. One drink equals 12 ounces of beer, 5 ounces of wine, or 1 ounces of hard liquor.  Do not use street drugs.  Do not share needles.  Ask  your health care provider for help if you need support or information about quitting drugs.  Tell your health care provider if you often feel depressed.  Tell your health care provider if you have ever been abused or do not feel safe at home. This information is not intended to replace advice given to you by your health care provider. Make sure you discuss any questions you have with your health care provider. Document Released: 08/29/2010 Document Revised: 07/22/2015 Document Reviewed: 11/17/2014  2017 Elsevier

## 2016-01-15 LAB — URINE CULTURE: Organism ID, Bacteria: NO GROWTH

## 2016-01-15 LAB — PROGESTERONE: PROGESTERONE: 9.4 ng/mL

## 2016-01-19 ENCOUNTER — Encounter: Payer: Self-pay | Admitting: Women's Health

## 2016-02-28 NOTE — L&D Delivery Note (Signed)
Delivery Note Pt reached complete dilation and pushed well for about an hour with and at 5:54 PM a healthy female was delivered via Vaginal, Spontaneous (Presentation: OA  ).  APGAR: 9, 9; weight  pending.   Placenta status: delivered spontaneously.  Cord:  with the following complications: none.  Anesthesia:  epidural Episiotomy: None Lacerations: 1st degree Suture Repair: 3.0 vicryl rapide Est. Blood Loss (mL): 250mL  Mom to postpartum.  Baby to Couplet care / Skin to Skin. D/w pt circumcision and they desire to proceed in hospital and have paid at office.  Oliver PilaKathy W Nakea Gouger 01/23/2017, 6:22 PM

## 2016-03-01 ENCOUNTER — Other Ambulatory Visit: Payer: Self-pay | Admitting: Women's Health

## 2016-03-01 DIAGNOSIS — Z1231 Encounter for screening mammogram for malignant neoplasm of breast: Secondary | ICD-10-CM

## 2016-04-13 ENCOUNTER — Ambulatory Visit: Payer: BLUE CROSS/BLUE SHIELD

## 2016-04-27 ENCOUNTER — Ambulatory Visit: Payer: BLUE CROSS/BLUE SHIELD

## 2016-06-02 ENCOUNTER — Telehealth: Payer: Self-pay | Admitting: *Deleted

## 2016-06-02 NOTE — Telephone Encounter (Signed)
Pt called [redacted] weeks pregnant requesting name of OB MD, I called pt back and received her voicemail, I left [facility] # to call to schedule.

## 2016-06-12 ENCOUNTER — Telehealth: Payer: Self-pay

## 2016-06-12 NOTE — Telephone Encounter (Signed)
Patient states she is currently [redacted] weeks pregnant and would like your OB recommendation. She said she originally thought she was was going to [facility] but now requests different practice recommendation.

## 2016-06-12 NOTE — Telephone Encounter (Signed)
Telephone call, congratulations given gave her the phone numbers for [facility] and  [facility] office.

## 2016-07-07 LAB — OB RESULTS CONSOLE ABO/RH: RH TYPE: POSITIVE

## 2016-07-07 LAB — OB RESULTS CONSOLE GC/CHLAMYDIA
Chlamydia: NEGATIVE
GC PROBE AMP, GENITAL: NEGATIVE

## 2016-07-07 LAB — OB RESULTS CONSOLE RUBELLA ANTIBODY, IGM: RUBELLA: IMMUNE

## 2016-07-07 LAB — OB RESULTS CONSOLE HIV ANTIBODY (ROUTINE TESTING): HIV: NONREACTIVE

## 2016-07-07 LAB — OB RESULTS CONSOLE HEPATITIS B SURFACE ANTIGEN: HEP B S AG: NEGATIVE

## 2016-07-07 LAB — OB RESULTS CONSOLE ANTIBODY SCREEN: Antibody Screen: NEGATIVE

## 2016-07-07 LAB — OB RESULTS CONSOLE RPR: RPR: NONREACTIVE

## 2016-07-12 ENCOUNTER — Encounter: Payer: Self-pay | Admitting: Gynecology

## 2016-12-15 LAB — OB RESULTS CONSOLE GBS: STREP GROUP B AG: NEGATIVE

## 2017-01-12 ENCOUNTER — Telehealth (HOSPITAL_COMMUNITY): Payer: Self-pay | Admitting: *Deleted

## 2017-01-12 ENCOUNTER — Encounter (HOSPITAL_COMMUNITY): Payer: Self-pay | Admitting: *Deleted

## 2017-01-12 NOTE — Telephone Encounter (Signed)
Preadmission screen  

## 2017-01-22 NOTE — H&P (Signed)
Brandi Velez is a 41 y.o. female G1P0 at 3941 1/7 weeks (EDD by known date of transfer for IVF pregnancy)  presenting for IOL.  Prenatal care complicated by h/o infertility conceiving with Femara and IUI--known DOC with early vanishing twin.   Pt also with AMA--declined genetic screens, but first trimester screen done in error and negative.  Otherwise no major issues.  OB History    Gravida Para Term Preterm AB Living   1             SAB TAB Ectopic Multiple Live Births                 Past Medical History:  Diagnosis Date  . Complication of anesthesia   . 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 Hx of varicella   . PONV (postoperative nausea and vomiting)    Past Surgical History:  Procedure Laterality Date  . BARTHOLIN CYST MARSUPIALIZATION  9-08  . 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 Social History:  reports that  has never smoked. she has never used smokeless tobacco. She reports that she drinks alcohol. She reports that she does not use drugs.     Maternal Diabetes: No Genetic Screening: Normal First trimester screen Maternal Ultrasounds/Referrals: Normal Fetal Ultrasounds or other Referrals:  None Maternal Substance Abuse:  No Significant Maternal Medications:  None Significant Maternal Lab Results:  None Other Comments:  None  Review of Systems  Gastrointestinal: Negative for abdominal pain.  Neurological: Negative for headaches.   Maternal Medical History:  Contractions: Frequency: irregular.   Perceived severity is mild.    Fetal activity: Perceived fetal activity is normal.    Prenatal Complications - Diabetes: none.      Last menstrual period 04/10/2016. Maternal Exam:  Uterine Assessment: Contraction strength is mild.  Contraction frequency is irregular.   Abdomen: Patient reports no  abdominal tenderness. Fetal presentation: vertex  Introitus: Normal vulva. Normal vagina.  Pelvis: adequate for delivery.      Physical Exam  Constitutional: She appears well-developed and well-nourished.  Cardiovascular: Normal rate.  Respiratory: Effort normal.  GI: Soft.  Genitourinary: Vagina normal.  Musculoskeletal: Normal range of motion.  Neurological: She is alert.  Psychiatric: She has a normal mood and affect.    Prenatal labs: ABO, Rh: O/Positive/-- (05/11 0000) Antibody: Negative (05/11 0000) Rubella: Immune (05/11 0000) RPR: Nonreactive (05/11 0000)  HBsAg: Negative (05/11 0000)  HIV: Non-reactive (05/11 0000)  GBS: Negative (10/19 0000)  One hour GCT 104 Hgb AA First trimester screen negative  Assessment/Plan: Pt admitted for labor with cervix unfavorable.  Plan cytotec ripening and then AROM pitocin when able.     Oliver PilaKathy W Brandi Velez 01/22/2017, 7:51 PM

## 2017-01-23 ENCOUNTER — Encounter (HOSPITAL_COMMUNITY): Payer: Self-pay | Admitting: Anesthesiology

## 2017-01-23 ENCOUNTER — Inpatient Hospital Stay (HOSPITAL_COMMUNITY)
Admission: RE | Admit: 2017-01-23 | Discharge: 2017-01-25 | DRG: 807 | Disposition: A | Discharge status: Home / Self Care | Payer: BLUE CROSS/BLUE SHIELD | Source: Ambulatory Visit | Attending: Obstetrics and Gynecology | Admitting: Obstetrics and Gynecology

## 2017-01-23 ENCOUNTER — Inpatient Hospital Stay (HOSPITAL_COMMUNITY): Payer: BLUE CROSS/BLUE SHIELD | Admitting: Anesthesiology

## 2017-01-23 ENCOUNTER — Encounter (HOSPITAL_COMMUNITY): Payer: Self-pay

## 2017-01-23 DIAGNOSIS — Z3A41 41 weeks gestation of pregnancy: Secondary | ICD-10-CM

## 2017-01-23 DIAGNOSIS — O480 Post-term pregnancy: Secondary | ICD-10-CM | POA: Diagnosis present

## 2017-01-23 LAB — TYPE AND SCREEN
ABO/RH(D): O POS
Antibody Screen: NEGATIVE

## 2017-01-23 LAB — RPR: RPR Ser Ql: NONREACTIVE

## 2017-01-23 LAB — CBC
HEMATOCRIT: 39.6 % (ref 36.0–46.0)
HEMOGLOBIN: 12.9 g/dL (ref 12.0–15.0)
MCH: 28.1 pg (ref 26.0–34.0)
MCHC: 32.6 g/dL (ref 30.0–36.0)
MCV: 86.3 fL (ref 78.0–100.0)
Platelets: 242 10*3/uL (ref 150–400)
RBC: 4.59 MIL/uL (ref 3.87–5.11)
RDW: 13.4 % (ref 11.5–15.5)
WBC: 8.8 10*3/uL (ref 4.0–10.5)

## 2017-01-23 LAB — ABO/RH: ABO/RH(D): O POS

## 2017-01-23 MED ORDER — ZOLPIDEM TARTRATE 5 MG PO TABS
5.0000 mg | ORAL_TABLET | Freq: Every evening | ORAL | Status: DC | PRN
Start: 2017-01-23 — End: 2017-01-25

## 2017-01-23 MED ORDER — DIPHENHYDRAMINE HCL 25 MG PO CAPS: 25.0000 mg | ORAL_CAPSULE | Freq: Four times a day (QID) | ORAL | Status: DC | PRN

## 2017-01-23 MED ORDER — EPHEDRINE 5 MG/ML INJ
10.0000 mg | INTRAVENOUS | Status: DC | PRN
  Filled 2017-01-23: qty 2

## 2017-01-23 MED ORDER — OXYCODONE-ACETAMINOPHEN 5-325 MG PO TABS
2.0000 | ORAL_TABLET | ORAL | Status: DC | PRN
Start: 2017-01-23 — End: 2017-01-23

## 2017-01-23 MED ORDER — OXYTOCIN 40 UNITS IN LACTATED RINGERS INFUSION - SIMPLE MED: 2.5000 [IU]/h | INTRAVENOUS | Status: DC

## 2017-01-23 MED ORDER — LACTATED RINGERS IV SOLN
INTRAVENOUS | Status: DC
  Administered 2017-01-23 (×3): via INTRAVENOUS

## 2017-01-23 MED ORDER — ONDANSETRON HCL 4 MG PO TABS: 4.0000 mg | ORAL_TABLET | ORAL | Status: DC | PRN

## 2017-01-23 MED ORDER — PHENYLEPHRINE 40 MCG/ML (10ML) SYRINGE FOR IV PUSH (FOR BLOOD PRESSURE SUPPORT)
80.0000 ug | PREFILLED_SYRINGE | INTRAVENOUS | Status: DC | PRN
  Filled 2017-01-23: qty 10
  Filled 2017-01-23: qty 5

## 2017-01-23 MED ORDER — BUTORPHANOL TARTRATE 1 MG/ML IJ SOLN: 1.0000 mg | INTRAMUSCULAR | Status: DC | PRN

## 2017-01-23 MED ORDER — MISOPROSTOL 25 MCG QUARTER TABLET
25.0000 ug | ORAL_TABLET | ORAL | Status: DC | PRN
  Administered 2017-01-23: 25 ug via VAGINAL
  Filled 2017-01-23 (×2): qty 1

## 2017-01-23 MED ORDER — LIDOCAINE HCL (PF) 1 % IJ SOLN
30.0000 mL | INTRAMUSCULAR | Status: DC | PRN
  Filled 2017-01-23: qty 30

## 2017-01-23 MED ORDER — DIBUCAINE 1 % RE OINT: 1.0000 "application " | TOPICAL_OINTMENT | RECTAL | Status: DC | PRN

## 2017-01-23 MED ORDER — SENNOSIDES-DOCUSATE SODIUM 8.6-50 MG PO TABS
2.0000 | ORAL_TABLET | ORAL | Status: DC
  Administered 2017-01-23 – 2017-01-24 (×2): 2 via ORAL
  Filled 2017-01-23 (×2): qty 2

## 2017-01-23 MED ORDER — TETANUS-DIPHTH-ACELL PERTUSSIS 5-2.5-18.5 LF-MCG/0.5 IM SUSP: 0.5000 mL | Freq: Once | INTRAMUSCULAR | Status: DC

## 2017-01-23 MED ORDER — OXYTOCIN 40 UNITS IN LACTATED RINGERS INFUSION - SIMPLE MED
1.0000 m[IU]/min | INTRAVENOUS | Status: DC
  Administered 2017-01-23: 2 m[IU]/min via INTRAVENOUS
  Filled 2017-01-23: qty 1000

## 2017-01-23 MED ORDER — ONDANSETRON HCL 4 MG/2ML IJ SOLN
4.0000 mg | Freq: Four times a day (QID) | INTRAMUSCULAR | Status: DC | PRN
  Administered 2017-01-23: 4 mg via INTRAVENOUS
  Filled 2017-01-23: qty 2

## 2017-01-23 MED ORDER — OXYTOCIN BOLUS FROM INFUSION
500.0000 mL | Freq: Once | INTRAVENOUS | Status: AC
  Administered 2017-01-23: 500 mL via INTRAVENOUS

## 2017-01-23 MED ORDER — COCONUT OIL OIL: 1.0000 "application " | TOPICAL_OIL | Status: DC | PRN

## 2017-01-23 MED ORDER — BENZOCAINE-MENTHOL 20-0.5 % EX AERO: 1.0000 "application " | INHALATION_SPRAY | CUTANEOUS | Status: DC | PRN

## 2017-01-23 MED ORDER — TERBUTALINE SULFATE 1 MG/ML IJ SOLN
0.2500 mg | Freq: Once | INTRAMUSCULAR | Status: DC | PRN
  Filled 2017-01-23: qty 1

## 2017-01-23 MED ORDER — SOD CITRATE-CITRIC ACID 500-334 MG/5ML PO SOLN: 30.0000 mL | ORAL | Status: DC | PRN

## 2017-01-23 MED ORDER — IBUPROFEN 600 MG PO TABS
600.0000 mg | ORAL_TABLET | Freq: Four times a day (QID) | ORAL | Status: DC
  Administered 2017-01-23 – 2017-01-25 (×6): 600 mg via ORAL
  Filled 2017-01-23 (×6): qty 1

## 2017-01-23 MED ORDER — FENTANYL 2.5 MCG/ML BUPIVACAINE 1/10 % EPIDURAL INFUSION (WH - ANES)
14.0000 mL/h | INTRAMUSCULAR | Status: DC | PRN
  Administered 2017-01-23: 14 mL/h via EPIDURAL
  Filled 2017-01-23: qty 100

## 2017-01-23 MED ORDER — ACETAMINOPHEN 325 MG PO TABS: 650.0000 mg | ORAL_TABLET | ORAL | Status: DC | PRN

## 2017-01-23 MED ORDER — OXYCODONE-ACETAMINOPHEN 5-325 MG PO TABS
1.0000 | ORAL_TABLET | ORAL | Status: DC | PRN
Start: 2017-01-23 — End: 2017-01-23

## 2017-01-23 MED ORDER — SIMETHICONE 80 MG PO CHEW: 80.0000 mg | CHEWABLE_TABLET | ORAL | Status: DC | PRN

## 2017-01-23 MED ORDER — DIPHENHYDRAMINE HCL 50 MG/ML IJ SOLN: 12.5000 mg | INTRAMUSCULAR | Status: DC | PRN

## 2017-01-23 MED ORDER — PRENATAL MULTIVITAMIN CH
1.0000 | ORAL_TABLET | Freq: Every day | ORAL | Status: DC
  Administered 2017-01-24 – 2017-01-25 (×2): 1 via ORAL
  Filled 2017-01-23 (×2): qty 1

## 2017-01-23 MED ORDER — PHENYLEPHRINE 40 MCG/ML (10ML) SYRINGE FOR IV PUSH (FOR BLOOD PRESSURE SUPPORT)
80.0000 ug | PREFILLED_SYRINGE | INTRAVENOUS | Status: AC | PRN
  Administered 2017-01-23 (×3): 80 ug via INTRAVENOUS
  Filled 2017-01-23: qty 10

## 2017-01-23 MED ORDER — WITCH HAZEL-GLYCERIN EX PADS: 1.0000 "application " | MEDICATED_PAD | CUTANEOUS | Status: DC | PRN

## 2017-01-23 MED ORDER — LACTATED RINGERS IV SOLN
500.0000 mL | Freq: Once | INTRAVENOUS | Status: AC
  Administered 2017-01-23: 500 mL via INTRAVENOUS

## 2017-01-23 MED ORDER — LACTATED RINGERS IV SOLN
500.0000 mL | INTRAVENOUS | Status: DC | PRN
  Administered 2017-01-23: 1000 mL via INTRAVENOUS

## 2017-01-23 MED ORDER — ONDANSETRON HCL 4 MG/2ML IJ SOLN: 4.0000 mg | INTRAMUSCULAR | Status: DC | PRN

## 2017-01-23 MED ORDER — LIDOCAINE HCL (PF) 1 % IJ SOLN
INTRAMUSCULAR | Status: DC | PRN
  Administered 2017-01-23: 8 mL via EPIDURAL
  Administered 2017-01-23: 4 mL via EPIDURAL

## 2017-01-23 NOTE — Progress Notes (Signed)
Patient ID: Brandi Velez, female   DOB: 11-Feb-1976, 41 y.o.   MRN: [id number] Pt feeling moderate contractions on pitocin, walking in room afeb vss FHR Category 1 with occasional mild variables  70/2/-2  AROM clear  Pitocin at 4mu, follow progress

## 2017-01-23 NOTE — Progress Notes (Signed)
This RN went in to do pt's one hour check. Pt having difficulty getting baby to latch. This RN offered to hep latch baby after fundal/lochia check. Mom insist on getting baby to latch and waiting on fundal check. This RN attempted to latch baby with #16 nipple shield d/t mom's short shafted nipples. Windell MouldingRuth from Lactation came in shortly after attempts started. Instructed mom to call after feeding session with lactation so that fundal check and vitals can be done.

## 2017-01-23 NOTE — Anesthesia Pain Management Evaluation Note (Signed)
  CRNA Pain Management Visit Note  Patient: Brandi Velez, 41 y.o., female  "Hello I am a [facility] of the anesthesia team at [facility]. We have an anesthesia team available at all times to provide care throughout the hospital, including epidural management and anesthesia for C-section. I don't know your plan for the delivery whether it a natural birth, water birth, IV sedation, nitrous supplementation, doula or epidural, but we want to meet your pain goals."   1.Was your pain managed to your expectations on prior hospitalizations?   Yes   2.What is your expectation for pain management during this hospitalization?     Epidural  3.How can we help you reach that goal? *epidural  Record the patient's initial score and the patient's pain goal.   Pain: 4/10  Pain Goal: 0/10 [facility] wants you to be able to say your pain was always managed very well.  Brandi Velez, Brandi Velez 01/23/2017

## 2017-01-23 NOTE — Anesthesia Procedure Notes (Signed)
Epidural Patient location during procedure: OB Start time: 01/23/2017 10:36 AM End time: 01/23/2017 10:40 AM  Staffing Anesthesiologist: Beryle LatheBrock, Thomas E, MD Performed: anesthesiologist   Preanesthetic Checklist Completed: patient identified, pre-op evaluation, timeout performed, IV checked, risks and benefits discussed and monitors and equipment checked  Epidural Patient position: sitting Prep: DuraPrep Patient monitoring: continuous pulse ox and blood pressure Approach: midline Location: L3-L4 Injection technique: LOR saline  Needle:  Needle type: Tuohy  Needle gauge: 17 G Needle length: 9 cm Needle insertion depth: 7 cm Catheter size: 19 Gauge Catheter at skin depth: 13 cm Test dose: negative and Other (1% lidocaine)  Additional Notes Patient identified. Risks including, but not limited to, bleeding, infection, nerve damage, paralysis, inadequate analgesia, blood pressure changes, nausea, vomiting, allergic reaction, postpartum back pain, itching, and headache were discussed. Patient expressed understanding and wished to proceed. Sterile prep and drape, including hand hygiene, mask, and sterile gloves were used. The patient was positioned and the spine was prepped. The skin was anesthetized with lidocaine. No paraesthesia or other complication noted. The patient did not experience any signs of intravascular injection such as tinnitus or metallic taste in mouth, nor signs of intrathecal spread such as rapid motor block. Please see nursing notes for vital signs. The patient tolerated the procedure well.   Leslye Peerhomas Brock, MDReason for block:procedure for pain

## 2017-01-23 NOTE — Progress Notes (Signed)
Patient ID: Brandi Velez, female   DOB: 08-Feb-1976, 41 y.o.   MRN: [id number] Pt received epidurl and is comfortable.  Had a prolonged decel after epidural related to lower blood pressure.  Resolved with ephedrine and blous.  Some deceased variability right after that, so pitocin d/c'd for an hour to give baby time to recover. Tracing now a category 1 with occasional early decelerations  Cervix 80/4+/-1  IUPC and FSE placed Will restart pitocin and increase slowly until note adequate contractions.

## 2017-01-23 NOTE — Lactation Note (Signed)
This note was copied from a baby's chart. Lactation Consultation Note  Patient Name: Brandi Marinell Blightvelin Ballantine ONGEX'BToday's Date: 01/23/2017 Reason for consult: Initial assessment   P1, Baby 4 hours old.  IVF infant.  Mother has great flow of colostrum. Baby 4441w1d, 6 lbs 2.8 oz.  Mother has short shaft nipples that evert with stimulation. Provided hand pump to prepump to make latching easier. RN assisting mother with latching with #16NS when LC entered room in football hold. LC removed NS which had good amount of colostrum in it and baby latched easily with breast compression.  Lips flanged.  Swallows observed. Observed feeding for 25 min on both breasts.  Mother was able to independently latch baby on R side with prepumping. Reviewed basics and answered questions.  Mother and father receptive to all teaching. Suggest mother only use NS if she is unable to latch baby.  Also discussed spoon feeding. Since baby is close to 5 lbs discussed q 3 hours feedings and the possibility of needing to start pumping tomorrow. Mom encouraged to feed baby 8-12 times/24 hours and with feeding cues.  Mom made aware of O/P services, breastfeeding support groups, community resources, and our phone # for post-discharge questions.      Maternal Data Has patient been taught Hand Expression?: Yes Does the patient have breastfeeding experience prior to this delivery?: No  Feeding Feeding Type: Breast Fed Length of feed: 12 min  LATCH Score Latch: Grasps breast easily, tongue down, lips flanged, rhythmical sucking.  Audible Swallowing: A few with stimulation  Type of Nipple: Everted at rest and after stimulation(short shaft)  Comfort (Breast/Nipple): Soft / non-tender  Hold (Positioning): Assistance needed to correctly position infant at breast and maintain latch.  LATCH Score: 8  Interventions Interventions: Breast feeding basics reviewed;Assisted with latch;Skin to skin;Breast massage;Hand express;Pre-pump if  needed;Breast compression;Adjust position;Support pillows;Position options;Expressed milk;Hand pump  Lactation Tools Discussed/Used     Consult Status Consult Status: Follow-up Date: 01/24/17 Follow-up type: In-patient    Brandi Velez, Brandi Velez 01/23/2017, 10:21 PM

## 2017-01-23 NOTE — Anesthesia Preprocedure Evaluation (Signed)
Anesthesia Evaluation  Patient identified by MRN, date of birth, ID band Patient awake    Reviewed: Allergy & Precautions, NPO status , Patient's Chart, lab work & pertinent test results  History of Anesthesia Complications (+) PONV  Airway Mallampati: II  TM Distance: >3 FB Neck ROM: Full    Dental  (+) Dental Advisory Given, Teeth Intact   Pulmonary neg pulmonary ROS,    Pulmonary exam normal breath sounds clear to auscultation       Cardiovascular negative cardio ROS Normal cardiovascular exam Rhythm:Regular Rate:Normal     Neuro/Psych negative neurological ROS  negative psych ROS   GI/Hepatic negative GI ROS, Neg liver ROS,   Endo/Other  negative endocrine ROS  Renal/GU negative Renal ROS  negative genitourinary   Musculoskeletal negative musculoskeletal ROS (+)   Abdominal   Peds  Hematology negative hematology ROS (+)   Anesthesia Other Findings   Reproductive/Obstetrics (+) Pregnancy                             Anesthesia Physical Anesthesia Plan  ASA: II  Anesthesia Plan: Epidural   Post-op Pain Management:    Induction:   PONV Risk Score and Plan:   Airway Management Planned: Natural Airway  Additional Equipment:   Intra-op Plan:   Post-operative Plan:   Informed Consent: I have reviewed the patients History and Physical, chart, labs and discussed the procedure including the risks, benefits and alternatives for the proposed anesthesia with the patient or authorized representative who has indicated his/her understanding and acceptance.     Plan Discussed with:   Anesthesia Plan Comments: (Labs reviewed. Platelets acceptable, patient not taking any blood thinning medications. Risks and benefits discussed with patient, patient expressed understanding and wished to proceed.)        Anesthesia Quick Evaluation

## 2017-01-24 LAB — CBC
HCT: 32.3 % — ABNORMAL LOW (ref 36.0–46.0)
Hemoglobin: 10.7 g/dL — ABNORMAL LOW (ref 12.0–15.0)
MCH: 28.8 pg (ref 26.0–34.0)
MCHC: 33.1 g/dL (ref 30.0–36.0)
MCV: 86.8 fL (ref 78.0–100.0)
PLATELETS: 191 10*3/uL (ref 150–400)
RBC: 3.72 MIL/uL — AB (ref 3.87–5.11)
RDW: 13.7 % (ref 11.5–15.5)
WBC: 11.3 10*3/uL — AB (ref 4.0–10.5)

## 2017-01-24 NOTE — Lactation Note (Signed)
This note was copied from a baby's chart. Lactation Consultation Note New mom, baby 6613 hrs old. Baby hadn't had much interest in BF. Mom trying to latch to breast semi flat nipple, baby unable to latch. Mom has #16 NS, mom demonstrated application after reviewed. Latched baby. Baby needed stimulated, breast massage , finally started suckling. Noted green colostrum in NS when finished. Shells given. Hand pump given, DEBP set up. Mom shown how to use DEBP & how to disassemble, clean, & reassemble parts. Mom knows to pump q3h for 15-20 min.  Mom encouraged to feed baby 8-12 times/24 hours and with feeding cues. Educated on waking baby if not cued in 3 hrs. Discussed w/mom supplementing since 6.lbs. Mom is to give colostrum 1st, then formula. Information sheet given on formula preparations, supply and demand, mom states has understanding, supplementing temporary until has more colostrum volume. Mom plans on BF exclusively, but want to supplement if needed until can give volume needed. Mom understands education.   Taught syring feeding w/gloved finger. Mom demonstrated. Spoon fed colostrum 1st.  Encouraged STS while feeding. Mom had outfit on, baby hot feeling.  Re[ported to RN and oncoming LC plan Patient Name: Brandi Velez Dara ZOXWR'UToday's Date: 01/24/2017 Reason for consult: Follow-up assessment;Difficult latch;Mother's request   Maternal Data    Feeding Feeding Type: Breast Fed Length of feed: 25 min  LATCH Score Latch: Repeated attempts needed to sustain latch, nipple held in mouth throughout feeding, stimulation needed to elicit sucking reflex.  Audible Swallowing: A few with stimulation  Type of Nipple: Everted at rest and after stimulation(very short shaft/semi flat)  Comfort (Breast/Nipple): Soft / non-tender  Hold (Positioning): Full assist, staff holds infant at breast  LATCH Score: 6  Interventions Interventions: Breast feeding basics reviewed;Assisted with latch;Breast  compression;Shells;Skin to skin;Adjust position;Breast massage;Support pillows;Hand pump;Hand express;Position options;DEBP;Pre-pump if needed;Expressed milk  Lactation Tools Discussed/Used Tools: Shells;Pump;Flanges;Nipple Shields Nipple shield size: 16 Flange Size: 21 Shell Type: Inverted Breast pump type: Double-Electric Breast Pump;Manual Pump Review: Setup, frequency, and cleaning;Milk Storage Initiated by:: Peri JeffersonL. Juri Dinning RN IBCLC Date initiated:: 01/24/17   Consult Status Consult Status: Follow-up Date: 01/25/17 Follow-up type: In-patient    Charyl DancerCARVER, Maida Widger G 01/24/2017, 7:24 AM

## 2017-01-24 NOTE — Anesthesia Postprocedure Evaluation (Signed)
Anesthesia Post Note  Patient: Brandi Velez  Procedure(s) Performed: AN AD HOC LABOR EPIDURAL     Patient location during evaluation: Mother Baby Anesthesia Type: Epidural Level of consciousness: awake Pain management: pain level controlled Vital Signs Assessment: post-procedure vital signs reviewed and stable Respiratory status: spontaneous breathing Cardiovascular status: stable Postop Assessment: no headache, no backache, epidural receding, patient able to bend at knees, no apparent nausea or vomiting and adequate PO intake Anesthetic complications: no    Last Vitals:  Vitals:   01/23/17 2320 01/24/17 0230  BP: (!) 106/57 (!) 114/57  Pulse: 79 66  Resp: 18 20  Temp: 36.9 C 36.8 C  SpO2:      Last Pain:  Vitals:   01/24/17 0230  TempSrc: Oral  PainSc: 0-No pain   Pain Goal:                 Chastity Noland

## 2017-01-24 NOTE — Lactation Note (Signed)
This note was copied from a baby's chart. Lactation Consultation Note  Patient Name: Boy Marinell Blightvelin Kise YNWGN'FToday's Date: 01/24/2017   Baby 24 hours old.  Mother wanted help with latching.  Baby was circumcised this afternoon and has been sleepy.  Unwrapped baby for feeding. Had mother prepump with hand pump to help evert her nipples. Mother doubting her milk supply.  Provided education regarding volume. Mother wanted review on hand expression, drops expressed bilaterally. Mother needed encouragement. Helped mother apply #16NS and prefilled w/ formula. Intermittent sucks and swallows observed but baby needs breast compression for stimulation. Recommend breastfeeding first and then offering either pumped breastmilk or formula after via curved tip syringe. Mom encouraged to feed baby 8-12 times/24 hours and with feeding cues.  Reviewed volume guidelines.       Maternal Data    Feeding    LATCH Score                   Interventions    Lactation Tools Discussed/Used     Consult Status      Hardie PulleyBerkelhammer, Ruth Boschen 01/24/2017, 5:55 PM

## 2017-01-24 NOTE — Progress Notes (Signed)
Patient ID: Brandi Velez, female   DOB: Oct 19, 1975, 41 y.o.   MRN: [id number] Pt doing well. Pain well controlled, lochia mild. Denies fever, chills, HA or CP. Ambulating and tolerating diet well. Bonding well with baby - breast/bottlefeeding. No complaints VSS ABD - soft, FF EXT - mild edema only, no homans  A/P: PPD#1 s/p svd - stable         Routine pp care         Discussed circumcision procedure; reviewed post care and answered questions

## 2017-01-25 MED ORDER — IBUPROFEN 600 MG PO TABS: 600.0000 mg | ORAL_TABLET | Freq: Four times a day (QID) | ORAL | 1 refills | Status: DC | PRN

## 2017-01-25 MED ORDER — PRENATAL MULTIVITAMIN CH: 1.0000 | ORAL_TABLET | Freq: Every day | ORAL | 3 refills | Status: DC

## 2017-01-25 NOTE — Progress Notes (Addendum)
Post Partum Day 1 Subjective: no complaints, up ad lib, voiding, tolerating PO and nl lochia, pain controlled  Objective: Blood pressure 116/63, pulse 66, temperature 97.8 F (36.6 C), temperature source Oral, resp. rate 18, height 5\' 4"  (1.626 m), weight 77.1 kg (170 lb), last menstrual period 04/10/2016, SpO2 99 %, unknown if currently breastfeeding.  Physical Exam:  General: alert and no distress Lochia: appropriate Uterine Fundus: firm   Recent Labs    01/23/17 0300 01/24/17 0503  HGB 12.9 10.7*  HCT 39.6 32.3*    Assessment/Plan: Discharge home. Breast feeding, lactation c/s.   Routine PP care.  D/c with motrin and PNV.  F/u 6 weeks   LOS: 2 days   Brandi Velez 01/25/2017, 8:08 AM

## 2017-01-25 NOTE — Lactation Note (Signed)
This note was copied from a baby's chart. Lactation Consultation Note: MD request of breastfeeding assistance. Infant is less than 6 lbs Mother has bilateral redness, bruising and bleeding of her nipples.  Mother was given comfort gels. She was taught to firm nipples with fingers or hand pump. Review of hand expressing colostrum. Mother has large drops of colostrum when hand expressed. Assist mother with positioning infant in cross cradle hold. Mother taught to latch infant with a off sided latch. Infant latched well and sustained latch for 20-25 mins. Observed frequent suckling and swallows. FOB taught to flange infants lips for wider gape. Mother denies pain with latch or feeding.  Mother was using a nipple shield. She reports that she prefers to latch infant on the bare breast. Suggested that mother alternate positions frequently . Mother has a Spectra pump at home. She was advised to post pump after feedings and supplement infant with any amts of ebm after breast feeding. Mother has nipple shield and has understanding of placement of shield if she needs it.  Lots of teaching with mother. Mother advised to continue to cue base feed infant and feed at least 8-12 times in 24 hours. Mother is aware of available LC services. [facility] if has questions or concerns. Mother to see Darryl NestleBarbara Carden [facility]IB[facility]   Patient Name: Boy Marinell Blightvelin Kanzler ZOXWR'UToday's Date: 01/25/2017 Reason for consult: Initial assessment   Maternal Data    Feeding Feeding Type: Breast Fed Length of feed: 40 min  LATCH Score Latch: Grasps breast easily, tongue down, lips flanged, rhythmical sucking.  Audible Swallowing: Spontaneous and intermittent  Type of Nipple: Everted at rest and after stimulation  Comfort (Breast/Nipple): Filling, red/small blisters or bruises, mild/mod discomfort  Hold (Positioning): Assistance needed to correctly position infant at breast and maintain  latch.  LATCH Score: 8  Interventions Interventions: Assisted with latch;Hand express;Pre-pump if needed;Breast compression;Adjust position;Support pillows;Position options;Expressed milk;Comfort gels;DEBP  Lactation Tools Discussed/Used     Consult Status Consult Status: Complete    Michel BickersKendrick, Raylie Maddison McCoy 01/25/2017, 2:04 PM

## 2017-01-25 NOTE — Discharge Summary (Signed)
OB Discharge Summary     Patient Name: Brandi Velez DOB: [birth year] MRN: [id number]  Date of admission: 01/23/2017 Delivering MD: Huel CoteICHARDSON, KATHY   Date of discharge: 01/25/2017  Admitting diagnosis: INDUCTION Intrauterine pregnancy: 8616w1d     Secondary diagnosis:  Active Problems:   Post term pregnancy at [redacted] weeks gestation   NSVD (normal spontaneous vaginal delivery)  Additional problems: N/A     Discharge diagnosis: Term Pregnancy Delivered                                                                                                Post partum procedures:N/A  Augmentation: AROM and Pitocin  Complications: None  Hospital course:  Induction of Labor With Vaginal Delivery   41 y.o. yo G1P1001 at 3616w1d was admitted to the hospital 01/23/2017 for induction of labor.  Indication for induction: Postdates.  Patient had an uncomplicated labor course as follows: Membrane Rupture Time/Date: 8:56 AM ,01/23/2017   Intrapartum Procedures: Episiotomy: None [1]                                         Lacerations:  1st degree [2]  Patient had delivery of a Viable infant.  Information for the patient's newborn:  Salem CasterBasile, Boy Heidee [[id number]][[id number]]  Delivery Method: Vaginal, Spontaneous(Filed from Delivery Summary)   01/23/2017  Details of delivery can be found in separate delivery note.  Patient had a routine postpartum course. Patient is discharged home 01/25/17.  Physical exam  Vitals:   01/23/17 2320 01/24/17 0230 01/24/17 1815 01/25/17 0527  BP: (!) 106/57 (!) 114/57 124/67 116/63  Pulse: 79 66 74 66  Resp: 18 20 17 18   Temp: 98.5 F (36.9 C) 98.3 F (36.8 C) 98.4 F (36.9 C) 97.8 F (36.6 C)  TempSrc: Oral Oral Oral Oral  SpO2:      Weight:      Height:       General: alert and no distress Lochia: appropriate Uterine Fundus: firm  Labs: Lab Results  Component Value Date   WBC 11.3 (H) 01/24/2017   HGB 10.7 (L) 01/24/2017   HCT 32.3 (L) 01/24/2017   MCV 86.8  01/24/2017   PLT 191 01/24/2017   CMP Latest Ref Rng & Units 01/14/2016  Glucose 65 - 99 mg/dL 97  BUN 7 - 25 mg/dL 19  Creatinine 7.250.50 - 3.661.10 mg/dL 4.400.60  Sodium 347135 - 425146 mmol/L 136  Potassium 3.5 - 5.3 mmol/L 3.7  Chloride 98 - 110 mmol/L 106  CO2 20 - 31 mmol/L 22  Calcium 8.6 - 10.2 mg/dL 8.9  Total Protein 6.1 - 8.1 g/dL 6.7  Total Bilirubin 0.2 - 1.2 mg/dL 0.5  Alkaline Phos 33 - 115 U/L 62  AST 10 - 30 U/L 18  ALT 6 - 29 U/L 18    Discharge instruction: per After Visit Summary and "Baby and Me Booklet".  After visit meds:  Allergies as of 01/25/2017   No Known Allergies     Medication List  TAKE these medications   ibuprofen 600 MG tablet Commonly known as:  ADVIL,MOTRIN Take 1 tablet (600 mg total) by mouth every 6 (six) hours as needed.   prenatal multivitamin Tabs tablet Take 1 tablet by mouth daily at 12 noon.       Diet: routine diet  Activity: Advance as tolerated. Pelvic rest for 6 weeks.   Outpatient follow up:6 weeks Follow up Appt:No future appointments. Follow up Visit:No Follow-up on file.  Postpartum contraception: Undecided  Newborn Data: Live born female  Birth Weight: 6 lb 2.8 oz (2800 g) APGAR: 9, 9  Newborn Delivery   Birth date/time:  01/23/2017 17:54:00 Delivery type:  Vaginal, Spontaneous     Baby Feeding: Breast Disposition:home with mother   01/25/2017 Sherian ReinJody Bovard-Stuckert, MD

## 2017-01-26 ENCOUNTER — Encounter: Payer: BLUE CROSS/BLUE SHIELD | Admitting: Women's Health

## 2017-12-05 ENCOUNTER — Encounter: Payer: BLUE CROSS/BLUE SHIELD | Admitting: Women's Health

## 2018-05-07 ENCOUNTER — Other Ambulatory Visit: Payer: Self-pay | Admitting: Obstetrics and Gynecology

## 2018-05-07 DIAGNOSIS — R928 Other abnormal and inconclusive findings on diagnostic imaging of breast: Secondary | ICD-10-CM

## 2018-05-16 ENCOUNTER — Ambulatory Visit
Admission: RE | Admit: 2018-05-16 | Discharge: 2018-05-16 | Disposition: A | Discharge status: Home / Self Care | Payer: BLUE CROSS/BLUE SHIELD | Source: Ambulatory Visit | Attending: Obstetrics and Gynecology | Admitting: Obstetrics and Gynecology

## 2018-05-16 ENCOUNTER — Other Ambulatory Visit: Payer: Self-pay

## 2018-05-16 DIAGNOSIS — R928 Other abnormal and inconclusive findings on diagnostic imaging of breast: Secondary | ICD-10-CM

## 2019-06-17 ENCOUNTER — Other Ambulatory Visit: Payer: Self-pay

## 2019-06-18 ENCOUNTER — Ambulatory Visit: Payer: 59 | Admitting: Women's Health

## 2019-06-18 ENCOUNTER — Encounter: Payer: Self-pay | Admitting: Women's Health

## 2019-06-18 VITALS — BP 122/78 | Ht 65.0 in | Wt 144.0 lb

## 2019-06-18 DIAGNOSIS — Z01419 Encounter for gynecological examination (general) (routine) without abnormal findings: Secondary | ICD-10-CM | POA: Diagnosis not present

## 2019-06-18 DIAGNOSIS — Z13220 Encounter for screening for lipoid disorders: Secondary | ICD-10-CM

## 2019-06-18 LAB — CBC WITH DIFFERENTIAL/PLATELET
Absolute Monocytes: 499 cells/uL (ref 200–950)
Basophils Absolute: 48 cells/uL (ref 0–200)
Basophils Relative: 1 %
Eosinophils Absolute: 202 cells/uL (ref 15–500)
Eosinophils Relative: 4.2 %
HCT: 41.3 % (ref 35.0–45.0)
Hemoglobin: 13.7 g/dL (ref 11.7–15.5)
Lymphs Abs: 1718 cells/uL (ref 850–3900)
MCH: 28.7 pg (ref 27.0–33.0)
MCHC: 33.2 g/dL (ref 32.0–36.0)
MCV: 86.6 fL (ref 80.0–100.0)
MPV: 10.3 fL (ref 7.5–12.5)
Monocytes Relative: 10.4 %
Neutro Abs: 2333 cells/uL (ref 1500–7800)
Neutrophils Relative %: 48.6 %
Platelets: 282 10*3/uL (ref 140–400)
RBC: 4.77 10*6/uL (ref 3.80–5.10)
RDW: 12 % (ref 11.0–15.0)
Total Lymphocyte: 35.8 %
WBC: 4.8 10*3/uL (ref 3.8–10.8)

## 2019-06-18 LAB — COMPREHENSIVE METABOLIC PANEL
AG Ratio: 1.4 (calc) (ref 1.0–2.5)
ALT: 12 U/L (ref 6–29)
AST: 13 U/L (ref 10–30)
Albumin: 4.1 g/dL (ref 3.6–5.1)
Alkaline phosphatase (APISO): 59 U/L (ref 31–125)
BUN: 16 mg/dL (ref 7–25)
CO2: 23 mmol/L (ref 20–32)
Calcium: 9.1 mg/dL (ref 8.6–10.2)
Chloride: 106 mmol/L (ref 98–110)
Creat: 0.68 mg/dL (ref 0.50–1.10)
Globulin: 2.9 g/dL (calc) (ref 1.9–3.7)
Glucose, Bld: 98 mg/dL (ref 65–99)
Potassium: 4 mmol/L (ref 3.5–5.3)
Sodium: 136 mmol/L (ref 135–146)
Total Bilirubin: 0.4 mg/dL (ref 0.2–1.2)
Total Protein: 7 g/dL (ref 6.1–8.1)

## 2019-06-18 LAB — LIPID PANEL
Cholesterol: 220 mg/dL — ABNORMAL HIGH (ref <200)
HDL: 70 mg/dL (ref >=50)
LDL Cholesterol (Calc): 135 mg/dL (calc) — ABNORMAL HIGH
Non-HDL Cholesterol (Calc): 150 mg/dL (calc) — ABNORMAL HIGH (ref <130)
Total CHOL/HDL Ratio: 3.1 (calc) (ref <5.0)
Triglycerides: 60 mg/dL (ref <150)

## 2019-06-18 NOTE — Patient Instructions (Addendum)
Vit D 1000 iu daily Congratulations on your son!!! Pleasure knowing you! Health Maintenance, Female Adopting a healthy lifestyle and getting preventive care are important in promoting health and wellness. Ask your health care provider about:  The right schedule for you to have regular tests and exams.  Things you can do on your own to prevent diseases and keep yourself healthy. What should I know about diet, weight, and exercise? Eat a healthy diet   Eat a diet that includes plenty of vegetables, fruits, low-fat dairy products, and lean protein.  Do not eat a lot of foods that are high in solid fats, added sugars, or sodium. Maintain a healthy weight Body mass index (BMI) is used to identify weight problems. It estimates body fat based on height and weight. Your health care provider can help determine your BMI and help you achieve or maintain a healthy weight. Get regular exercise Get regular exercise. This is one of the most important things you can do for your health. Most adults should:  Exercise for at least 150 minutes each week. The exercise should increase your heart rate and make you sweat (moderate-intensity exercise).  Do strengthening exercises at least twice a week. This is in addition to the moderate-intensity exercise.  Spend less time sitting. Even light physical activity can be beneficial. Watch cholesterol and blood lipids Have your blood tested for lipids and cholesterol at 45 years of age, then have this test every 5 years. Have your cholesterol levels checked more often if:  Your lipid or cholesterol levels are high.  You are older than 45 years of age.  You are at high risk for heart disease. What should I know about cancer screening? Depending on your health history and family history, you may need to have cancer screening at various ages. This may include screening for:  Breast cancer.  Cervical cancer.  Colorectal cancer.  Skin cancer.  Lung  cancer. What should I know about heart disease, diabetes, and high blood pressure? Blood pressure and heart disease  High blood pressure causes heart disease and increases the risk of stroke. This is more likely to develop in people who have high blood pressure readings, are of African descent, or are overweight.  Have your blood pressure checked: ? Every 3-5 years if you are 74-83 years of age. ? Every year if you are 8 years old or older. Diabetes Have regular diabetes screenings. This checks your fasting blood sugar level. Have the screening done:  Once every three years after age 24 if you are at a normal weight and have a low risk for diabetes.  More often and at a younger age if you are overweight or have a high risk for diabetes. What should I know about preventing infection? Hepatitis B If you have a higher risk for hepatitis B, you should be screened for this virus. Talk with your health care provider to find out if you are at risk for hepatitis B infection. Hepatitis C Testing is recommended for:  Everyone born from [birth year] through 1966.  Anyone with known risk factors for hepatitis C. Sexually transmitted infections (STIs)  Get screened for STIs, including gonorrhea and chlamydia, if: ? You are sexually active and are younger than 45 years of age. ? You are older than 45 years of age and your health care provider tells you that you are at risk for this type of infection. ? Your sexual activity has changed since you were last screened, and you are at  increased risk for chlamydia or gonorrhea. Ask your health care provider if you are at risk.  Ask your health care provider about whether you are at high risk for HIV. Your health care provider may recommend a prescription medicine to help prevent HIV infection. If you choose to take medicine to prevent HIV, you should first get tested for HIV. You should then be tested every 3 months for as long as you are taking the  medicine. Pregnancy  If you are about to stop having your period (premenopausal) and you may become pregnant, seek counseling before you get pregnant.  Take 400 to 800 micrograms (mcg) of folic acid every day if you become pregnant.  Ask for birth control (contraception) if you want to prevent pregnancy. Osteoporosis and menopause Osteoporosis is a disease in which the bones lose minerals and strength with aging. This can result in bone fractures. If you are 24 years old or older, or if you are at risk for osteoporosis and fractures, ask your health care provider if you should:  Be screened for bone loss.  Take a calcium or vitamin D supplement to lower your risk of fractures.  Be given hormone replacement therapy (HRT) to treat symptoms of menopause. Follow these instructions at home: Lifestyle  Do not use any products that contain nicotine or tobacco, such as cigarettes, e-cigarettes, and chewing tobacco. If you need help quitting, ask your health care provider.  Do not use street drugs.  Do not share needles.  Ask your health care provider for help if you need support or information about quitting drugs. Alcohol use  Do not drink alcohol if: ? Your health care provider tells you not to drink. ? You are pregnant, may be pregnant, or are planning to become pregnant.  If you drink alcohol: ? Limit how much you use to 0-1 drink a day. ? Limit intake if you are breastfeeding.  Be aware of how much alcohol is in your drink. In the [place] one drink equals one 12 oz bottle of beer (355 mL), one 5 oz glass of wine (148 mL), or one 1 oz glass of hard liquor (44 mL). General instructions  Schedule regular health, dental, and eye exams.  Stay current with your vaccines.  Tell your health care provider if: ? You often feel depressed. ? You have ever been abused or do not feel safe at home. Summary  Adopting a healthy lifestyle and getting preventive care are important in  promoting health and wellness.  Follow your health care provider's instructions about healthy diet, exercising, and getting tested or screened for diseases.  Follow your health care provider's instructions on monitoring your cholesterol and blood pressure. This information is not intended to replace advice given to you by your health care provider. Make sure you discuss any questions you have with your health care provider. Document Revised: 02/06/2018 Document Reviewed: 02/06/2018 Elsevier Patient Education  2020 [employer].

## 2019-06-18 NOTE — Progress Notes (Signed)
   Brandi Velez 03-05-1975 [id number]   History:  44 y.o. presents for annual exam.  Regular monthly cycle history of infertility.  Delivered a healthy son 2-1/2 years ago by IUI, husband low sperm count.  Normal Pap and mammogram history.  Mammogram in 2020 normal after ultrasound.  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  No problems with pregnancy or after delivery.  Gynecologic History Patient's last menstrual period was 06/09/2019. Period Cycle (Days): 28 Period Duration (Days): 5 Period Pattern: Regular Menstrual Flow: Heavy Menstrual Control: Maxi pad, Tampon Dysmenorrhea: (!) Mild Dysmenorrhea Symptoms: Cramping  Past medical history, past surgical history, family history and social history were all reviewed and documented in the EPIC chart.  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  ROS:  A ROS was performed and pertinent positives and negatives are included.  Exam:  Vitals:   06/18/19 0803  BP: 122/78  Weight: 144 lb (65.3 kg)  Height: 5\' 5"  (1.651 m)   Body mass index is 23.96 kg/m.  General appearance:  Normal Thyroid:  Symmetrical, normal in size, without palpable masses or nodularity. Respiratory  Auscultation:  Clear without wheezing or rhonchi Cardiovascular  Auscultation:  Regular rate, without rubs, murmurs or gallops  Edema/varicosities:  Not grossly evident Abdominal  Soft,nontender, without masses, guarding or rebound.  Liver/spleen:  No organomegaly noted  Hernia:  None appreciated  Skin  Inspection:  Grossly normal   Breasts: Examined lying and sitting.   Right: Without masses, retractions, discharge or axillary adenopathy.   Left: Without masses, retractions, discharge or axillary adenopathy. Gentitourinary   Inguinal/mons:  Normal without inguinal adenopathy  External genitalia:  Normal  BUS/Urethra/Skene's glands:  Normal  Vagina:  Normal  Cervix:  Normal  Uterus: The patient normal in  size, shape and contour.  Midline and mobile  Adnexa/parametria:     Rt: Without masses or tenderness.   Lt: Without masses or tenderness.  Anus and perineum: Normal  Digital rectal exam: Normal sphincter tone without palpated masses or tenderness  Assessment/Plan:  44 y.o. M HF G1 P1 for annual exam with no complaints.  Regular monthly cycle/history of infertility  Plan: Contraception reviewed, declines.  SBEs, continue annual 3D screening mammogram, calcium rich foods, exercise, MVI daily encouraged..  CBC, CMP, lipid panel, Pap with HR HPV typing, new screening guidelines reviewed.    [facility], 12:38 PM 06/18/2019

## 2019-06-20 LAB — URINALYSIS, COMPLETE W/RFL CULTURE
Bilirubin Urine: NEGATIVE
Glucose, UA: NEGATIVE
Hgb urine dipstick: NEGATIVE
Hyaline Cast: NONE SEEN /LPF
Ketones, ur: NEGATIVE
Leukocyte Esterase: NEGATIVE
Nitrites, Initial: NEGATIVE
Protein, ur: NEGATIVE
Specific Gravity, Urine: 1.02 (ref 1.001–1.03)
pH: 6.5 (ref 5.0–8.0)

## 2019-06-20 LAB — URINE CULTURE
MICRO NUMBER:: 10392819
Result:: NO GROWTH
SPECIMEN QUALITY:: ADEQUATE

## 2019-06-20 LAB — CULTURE INDICATED

## 2019-06-24 ENCOUNTER — Other Ambulatory Visit: Payer: Self-pay | Admitting: Obstetrics and Gynecology

## 2019-06-24 DIAGNOSIS — Z1231 Encounter for screening mammogram for malignant neoplasm of breast: Secondary | ICD-10-CM

## 2019-06-26 ENCOUNTER — Ambulatory Visit
Admission: RE | Admit: 2019-06-26 | Discharge: 2019-06-26 | Disposition: A | Discharge status: Home / Self Care | Payer: 59 | Source: Ambulatory Visit

## 2019-06-26 ENCOUNTER — Other Ambulatory Visit: Payer: Self-pay | Admitting: Women's Health

## 2019-06-26 ENCOUNTER — Other Ambulatory Visit: Payer: Self-pay

## 2019-06-26 DIAGNOSIS — Z1231 Encounter for screening mammogram for malignant neoplasm of breast: Secondary | ICD-10-CM

## 2020-06-24 ENCOUNTER — Encounter: Payer: Self-pay | Admitting: Nurse Practitioner

## 2020-06-24 ENCOUNTER — Ambulatory Visit (INDEPENDENT_AMBULATORY_CARE_PROVIDER_SITE_OTHER): Payer: No Typology Code available for payment source | Admitting: Nurse Practitioner

## 2020-06-24 ENCOUNTER — Other Ambulatory Visit (HOSPITAL_COMMUNITY)
Admission: RE | Admit: 2020-06-24 | Discharge: 2020-06-24 | Disposition: A | Discharge status: Home / Self Care | Payer: No Typology Code available for payment source | Source: Ambulatory Visit | Attending: Nurse Practitioner | Admitting: Nurse Practitioner

## 2020-06-24 ENCOUNTER — Other Ambulatory Visit: Payer: Self-pay

## 2020-06-24 VITALS — BP 120/74 | Ht 64.0 in | Wt 139.0 lb

## 2020-06-24 DIAGNOSIS — Z01419 Encounter for gynecological examination (general) (routine) without abnormal findings: Secondary | ICD-10-CM | POA: Diagnosis present

## 2020-06-24 NOTE — Progress Notes (Signed)
   Brandi Velez Oct 27, 1975 [id number]   History:  45 y.o. G1P1001 presents for annual exam without GYN complaints. Monthly cycles. History of infertility/low sperm count, successful IUI 3 years ago. Normal pap and mammogram history.   Gynecologic History Patient's last menstrual period was 06/20/2020. Period Cycle (Days): 28 Period Duration (Days): 4 Period Pattern: Regular Menstrual Flow: Moderate Dysmenorrhea: None Contraception/Family planning: none  Health Maintenance Last Pap: 01/07/2015. Results were: normal Last mammogram: 06/26/2019. Results were: normal Last colonoscopy: N/A Last Dexa: N/A  Past medical history, past surgical history, family history and social history were all reviewed and documented in the EPIC chart. 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   ROS:  A ROS was performed and pertinent positives and negatives are included.  Exam:  Vitals:   06/24/20 1500  BP: 120/74  Weight: 139 lb (63 kg)  Height: 5\' 4"  (1.626 m)   Body mass index is 23.86 kg/m.  General appearance:  Normal Thyroid:  Symmetrical, normal in size, without palpable masses or nodularity. Respiratory  Auscultation:  Clear without wheezing or rhonchi Cardiovascular  Auscultation:  Regular rate, without rubs, murmurs or gallops  Edema/varicosities:  Not grossly evident Abdominal  Soft,nontender, without masses, guarding or rebound.  Liver/spleen:  No organomegaly noted  Hernia:  None appreciated  Skin  Inspection:  Grossly normal Breasts: Examined lying and sitting.   Right: Without masses, retractions, nipple discharge or axillary adenopathy.   Left: Without masses, retractions, nipple discharge or axillary adenopathy. Gentitourinary   Inguinal/mons:  Normal without inguinal adenopathy  External genitalia:  Normal appearing vulva with no masses, tenderness, or lesions  BUS/Urethra/Skene's glands:  Normal  Vagina:  Normal appearing with normal color and discharge, no  lesions  Cervix:  Normal appearing without discharge or lesions  Uterus:  Normal in size, shape and contour.  Midline and mobile, nontender  Adnexa/parametria:     Rt: Normal in size, without masses or tenderness.   Lt: Normal in size, without masses or tenderness.  Anus and perineum: Normal  Digital rectal exam: Normal sphincter tone without palpated masses or tenderness  Assessment/Plan:  45 y.o. G1P1001 for annual exam.   Well female exam with routine gynecological exam - Plan: CBC with Differential/Platelet, Comprehensive metabolic panel, Lipid panel, Cytology - PAP( [facility]). Education provided on SBEs, importance of preventative screenings, current guidelines, high calcium diet, regular exercise, and multivitamin daily. Will return for fasting labs.   Screening for cervical cancer - Normal Pap history. Pap with HR HPV today.  Screening for breast cancer - Normal mammogram history.  Continue annual screenings.  Normal breast exam today.  Return in 1 year for annual.    54 DNP, 3:23 PM 06/24/2020

## 2020-06-24 NOTE — Patient Instructions (Signed)
Health Maintenance, Female Adopting a healthy lifestyle and getting preventive care are important in promoting health and wellness. Ask your health care provider about:  The right schedule for you to have regular tests and exams.  Things you can do on your own to prevent diseases and keep yourself healthy. What should I know about diet, weight, and exercise? Eat a healthy diet  Eat a diet that includes plenty of vegetables, fruits, low-fat dairy products, and lean protein.  Do not eat a lot of foods that are high in solid fats, added sugars, or sodium.   Maintain a healthy weight Body mass index (BMI) is used to identify weight problems. It estimates body fat based on height and weight. Your health care provider can help determine your BMI and help you achieve or maintain a healthy weight. Get regular exercise Get regular exercise. This is one of the most important things you can do for your health. Most adults should:  Exercise for at least 150 minutes each week. The exercise should increase your heart rate and make you sweat (moderate-intensity exercise).  Do strengthening exercises at least twice a week. This is in addition to the moderate-intensity exercise.  Spend less time sitting. Even light physical activity can be beneficial. Watch cholesterol and blood lipids Have your blood tested for lipids and cholesterol at 46 years of age, then have this test every 5 years. Have your cholesterol levels checked more often if:  Your lipid or cholesterol levels are high.  You are older than 46 years of age.  You are at high risk for heart disease. What should I know about cancer screening? Depending on your health history and family history, you may need to have cancer screening at various ages. This may include screening for:  Breast cancer.  Cervical cancer.  Colorectal cancer.  Skin cancer.  Lung cancer. What should I know about heart disease, diabetes, and high blood  pressure? Blood pressure and heart disease  High blood pressure causes heart disease and increases the risk of stroke. This is more likely to develop in people who have high blood pressure readings, are of African descent, or are overweight.  Have your blood pressure checked: ? Every 3-5 years if you are 18-39 years of age. ? Every year if you are 40 years old or older. Diabetes Have regular diabetes screenings. This checks your fasting blood sugar level. Have the screening done:  Once every three years after age 40 if you are at a normal weight and have a low risk for diabetes.  More often and at a younger age if you are overweight or have a high risk for diabetes. What should I know about preventing infection? Hepatitis B If you have a higher risk for hepatitis B, you should be screened for this virus. Talk with your health care provider to find out if you are at risk for hepatitis B infection. Hepatitis C Testing is recommended for:  Everyone born from 1945 through 1965.  Anyone with known risk factors for hepatitis C. Sexually transmitted infections (STIs)  Get screened for STIs, including gonorrhea and chlamydia, if: ? You are sexually active and are younger than 46 years of age. ? You are older than 46 years of age and your health care provider tells you that you are at risk for this type of infection. ? Your sexual activity has changed since you were last screened, and you are at increased risk for chlamydia or gonorrhea. Ask your health care provider   if you are at risk.  Ask your health care provider about whether you are at high risk for HIV. Your health care provider may recommend a prescription medicine to help prevent HIV infection. If you choose to take medicine to prevent HIV, you should first get tested for HIV. You should then be tested every 3 months for as long as you are taking the medicine. Pregnancy  If you are about to stop having your period (premenopausal) and  you may become pregnant, seek counseling before you get pregnant.  Take 400 to 800 micrograms (mcg) of folic acid every day if you become pregnant.  Ask for birth control (contraception) if you want to prevent pregnancy. Osteoporosis and menopause Osteoporosis is a disease in which the bones lose minerals and strength with aging. This can result in bone fractures. If you are 65 years old or older, or if you are at risk for osteoporosis and fractures, ask your health care provider if you should:  Be screened for bone loss.  Take a calcium or vitamin D supplement to lower your risk of fractures.  Be given hormone replacement therapy (HRT) to treat symptoms of menopause. Follow these instructions at home: Lifestyle  Do not use any products that contain nicotine or tobacco, such as cigarettes, e-cigarettes, and chewing tobacco. If you need help quitting, ask your health care provider.  Do not use street drugs.  Do not share needles.  Ask your health care provider for help if you need support or information about quitting drugs. Alcohol use  Do not drink alcohol if: ? Your health care provider tells you not to drink. ? You are pregnant, may be pregnant, or are planning to become pregnant.  If you drink alcohol: ? Limit how much you use to 0-1 drink a day. ? Limit intake if you are breastfeeding.  Be aware of how much alcohol is in your drink. In the [place] one drink equals one 12 oz bottle of beer (355 mL), one 5 oz glass of wine (148 mL), or one 1 oz glass of hard liquor (44 mL). General instructions  Schedule regular health, dental, and eye exams.  Stay current with your vaccines.  Tell your health care provider if: ? You often feel depressed. ? You have ever been abused or do not feel safe at home. Summary  Adopting a healthy lifestyle and getting preventive care are important in promoting health and wellness.  Follow your health care provider's instructions about healthy  diet, exercising, and getting tested or screened for diseases.  Follow your health care provider's instructions on monitoring your cholesterol and blood pressure. This information is not intended to replace advice given to you by your health care provider. Make sure you discuss any questions you have with your health care provider. Document Revised: 02/06/2018 Document Reviewed: 02/06/2018 Elsevier Patient Education  2021 Elsevier Inc.  

## 2020-06-25 LAB — CYTOLOGY - PAP
Comment: NEGATIVE
Diagnosis: NEGATIVE
High risk HPV: NEGATIVE

## 2020-07-02 ENCOUNTER — Other Ambulatory Visit: Payer: Self-pay

## 2020-07-02 ENCOUNTER — Other Ambulatory Visit: Payer: No Typology Code available for payment source

## 2020-07-02 DIAGNOSIS — Z01419 Encounter for gynecological examination (general) (routine) without abnormal findings: Secondary | ICD-10-CM

## 2020-07-03 LAB — COMPREHENSIVE METABOLIC PANEL
AG Ratio: 1.4 (calc) (ref 1.0–2.5)
ALT: 14 U/L (ref 6–29)
AST: 14 U/L (ref 10–35)
Albumin: 4.1 g/dL (ref 3.6–5.1)
Alkaline phosphatase (APISO): 61 U/L (ref 31–125)
BUN: 25 mg/dL (ref 7–25)
CO2: 24 mmol/L (ref 20–32)
Calcium: 9 mg/dL (ref 8.6–10.2)
Chloride: 106 mmol/L (ref 98–110)
Creat: 0.7 mg/dL (ref 0.50–1.10)
Globulin: 2.9 g/dL (calc) (ref 1.9–3.7)
Glucose, Bld: 102 mg/dL — ABNORMAL HIGH (ref 65–99)
Potassium: 4.1 mmol/L (ref 3.5–5.3)
Sodium: 139 mmol/L (ref 135–146)
Total Bilirubin: 0.3 mg/dL (ref 0.2–1.2)
Total Protein: 7 g/dL (ref 6.1–8.1)

## 2020-07-03 LAB — CBC WITH DIFFERENTIAL/PLATELET
Absolute Monocytes: 556 cells/uL (ref 200–950)
Basophils Absolute: 40 cells/uL (ref 0–200)
Basophils Relative: 1 %
Eosinophils Absolute: 172 cells/uL (ref 15–500)
Eosinophils Relative: 4.3 %
HCT: 41.4 % (ref 35.0–45.0)
Hemoglobin: 13.3 g/dL (ref 11.7–15.5)
Lymphs Abs: 1444 cells/uL (ref 850–3900)
MCH: 27.8 pg (ref 27.0–33.0)
MCHC: 32.1 g/dL (ref 32.0–36.0)
MCV: 86.4 fL (ref 80.0–100.0)
MPV: 10.5 fL (ref 7.5–12.5)
Monocytes Relative: 13.9 %
Neutro Abs: 1788 cells/uL (ref 1500–7800)
Neutrophils Relative %: 44.7 %
Platelets: 273 10*3/uL (ref 140–400)
RBC: 4.79 10*6/uL (ref 3.80–5.10)
RDW: 12.1 % (ref 11.0–15.0)
Total Lymphocyte: 36.1 %
WBC: 4 10*3/uL (ref 3.8–10.8)

## 2020-07-03 LAB — LIPID PANEL
Cholesterol: 206 mg/dL — ABNORMAL HIGH (ref <200)
HDL: 66 mg/dL (ref >=50)
LDL Cholesterol (Calc): 125 mg/dL (calc) — ABNORMAL HIGH
Non-HDL Cholesterol (Calc): 140 mg/dL (calc) — ABNORMAL HIGH (ref <130)
Total CHOL/HDL Ratio: 3.1 (calc) (ref <5.0)
Triglycerides: 63 mg/dL (ref <150)

## 2020-07-15 ENCOUNTER — Encounter (HOSPITAL_BASED_OUTPATIENT_CLINIC_OR_DEPARTMENT_OTHER): Payer: Self-pay

## 2020-07-15 ENCOUNTER — Other Ambulatory Visit (HOSPITAL_BASED_OUTPATIENT_CLINIC_OR_DEPARTMENT_OTHER): Payer: Self-pay | Admitting: Nurse Practitioner

## 2020-07-15 ENCOUNTER — Other Ambulatory Visit: Payer: Self-pay

## 2020-07-15 ENCOUNTER — Ambulatory Visit (HOSPITAL_BASED_OUTPATIENT_CLINIC_OR_DEPARTMENT_OTHER)
Admission: RE | Admit: 2020-07-15 | Discharge: 2020-07-15 | Disposition: A | Discharge status: Home / Self Care | Payer: No Typology Code available for payment source | Source: Ambulatory Visit | Attending: Nurse Practitioner | Admitting: Nurse Practitioner

## 2020-07-15 DIAGNOSIS — Z1231 Encounter for screening mammogram for malignant neoplasm of breast: Secondary | ICD-10-CM

## 2021-06-27 ENCOUNTER — Ambulatory Visit: Payer: No Typology Code available for payment source | Admitting: Nurse Practitioner

## 2021-06-28 ENCOUNTER — Other Ambulatory Visit: Payer: Self-pay | Admitting: Nurse Practitioner

## 2021-06-28 ENCOUNTER — Encounter: Payer: Self-pay | Admitting: Nurse Practitioner

## 2021-06-28 ENCOUNTER — Ambulatory Visit (INDEPENDENT_AMBULATORY_CARE_PROVIDER_SITE_OTHER): Payer: No Typology Code available for payment source | Admitting: Nurse Practitioner

## 2021-06-28 VITALS — BP 118/76 | Ht 63.0 in | Wt 140.0 lb

## 2021-06-28 DIAGNOSIS — E785 Hyperlipidemia, unspecified: Secondary | ICD-10-CM | POA: Diagnosis not present

## 2021-06-28 DIAGNOSIS — Z01419 Encounter for gynecological examination (general) (routine) without abnormal findings: Secondary | ICD-10-CM | POA: Diagnosis not present

## 2021-06-28 DIAGNOSIS — Z1231 Encounter for screening mammogram for malignant neoplasm of breast: Secondary | ICD-10-CM

## 2021-06-28 NOTE — Patient Instructions (Signed)
Schedule Colonoscopy! ?[facility] ?[phone] ?[address] [place], [place] [address] ? ?

## 2021-06-28 NOTE — Progress Notes (Signed)
? ?  Brandi Velez 1975-05-09 [id number] ? ? ?History:  46 y.o. G1P1001 presents for annual exam without GYN complaints. Monthly cycles. Normal pap and mammogram history.  ? ?Gynecologic History ?Patient's last menstrual period was 06/17/2021. ?Period Cycle (Days): 28 ?Period Duration (Days): 3 ?Period Pattern: Regular ?Menstrual Flow: Moderate ?Dysmenorrhea: None ?Contraception/Family planning: none ?Sexually active: Yes ? ?Health Maintenance ?Last Pap: 06/24/2020. Results were: Normal, 5-year repeat ?Last mammogram: 07/15/2020. Results were: Normal ?Last colonoscopy: Never ?Last Dexa: N/A ? ?Past medical history, past surgical history, family history and social history were all reviewed and documented in the EPIC chart. 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  ? ?ROS:  A ROS was performed and pertinent positives and negatives are included. ? ?Exam: ? ?Vitals:  ? 06/28/21 0821  ?BP: 118/76  ?Weight: 140 lb (63.5 kg)  ?Height: 5\' 3"  (1.6 m)  ? ? ?Body mass index is 24.8 kg/m?. ? ?General appearance:  Normal ?Thyroid:  Symmetrical, normal in size, without palpable masses or nodularity. ?Respiratory ? Auscultation:  Clear without wheezing or rhonchi ?Cardiovascular ? Auscultation:  Regular rate, without rubs, murmurs or gallops ? Edema/varicosities:  Not grossly evident ?Abdominal ? Soft,nontender, without masses, guarding or rebound. ? Liver/spleen:  No organomegaly noted ? Hernia:  None appreciated ? Skin ? Inspection:  Grossly normal ?Breasts: Examined lying and sitting.  ? Right: Without masses, retractions, nipple discharge or axillary adenopathy. ? ? Left: Without masses, retractions, nipple discharge or axillary adenopathy. ?Gentitourinary  ? Inguinal/mons:  Normal without inguinal adenopathy ? External genitalia:  Normal appearing vulva with no masses, tenderness, or lesions ? BUS/Urethra/Skene's glands:  Normal ? Vagina:  Normal appearing with normal color and discharge, no lesions ? Cervix:  Normal appearing  without discharge or lesions ? Uterus:  Normal in size, shape and contour.  Midline and mobile, nontender ? Adnexa/parametria:   ?  Rt: Normal in size, without masses or tenderness. ?  Lt: Normal in size, without masses or tenderness. ? Anus and perineum: Normal ? Digital rectal exam: Normal sphincter tone without palpated masses or tenderness ? ?Patient informed chaperone available to be present for breast and pelvic exam. Patient has requested no chaperone to be present. Patient has been advised what will be completed during breast and pelvic exam.  ? ?Assessment/Plan:  46 y.o. G1P1001 for annual exam.  ? ?Well female exam with routine gynecological exam - Plan: CBC with Differential/Platelet, Comprehensive metabolic panel. Education provided on SBEs, importance of preventative screenings, current guidelines, high calcium diet, regular exercise, and multivitamin daily. Will return for fasting labs.  ? ?Hyperlipidemia, unspecified hyperlipidemia type - Plan: Lipid panel ? ?Screening for cervical cancer - Normal Pap history. Will repeat at 5-year interval per guidelines.  ? ?Screening for breast cancer - Normal mammogram history.  Continue annual screenings.  Normal breast exam today. ? ?Screening for colon cancer - Discussed current guidelines. Information provided on  GI for scheduling colonoscopy.  ? ?Return in 1 year for annual.  ? ? ? ?49 DNP, 8:49 AM 06/28/2021 ? ?

## 2021-07-06 ENCOUNTER — Other Ambulatory Visit: Payer: No Typology Code available for payment source

## 2021-07-06 DIAGNOSIS — E785 Hyperlipidemia, unspecified: Secondary | ICD-10-CM

## 2021-07-06 DIAGNOSIS — Z01419 Encounter for gynecological examination (general) (routine) without abnormal findings: Secondary | ICD-10-CM

## 2021-07-07 LAB — CBC WITH DIFFERENTIAL/PLATELET
Absolute Monocytes: 509 cells/uL (ref 200–950)
Basophils Absolute: 48 cells/uL (ref 0–200)
Basophils Relative: 0.9 %
Eosinophils Absolute: 159 cells/uL (ref 15–500)
Eosinophils Relative: 3 %
HCT: 40.7 % (ref 35.0–45.0)
Hemoglobin: 13.2 g/dL (ref 11.7–15.5)
Lymphs Abs: 1797 cells/uL (ref 850–3900)
MCH: 27.7 pg (ref 27.0–33.0)
MCHC: 32.4 g/dL (ref 32.0–36.0)
MCV: 85.3 fL (ref 80.0–100.0)
MPV: 10.5 fL (ref 7.5–12.5)
Monocytes Relative: 9.6 %
Neutro Abs: 2788 cells/uL (ref 1500–7800)
Neutrophils Relative %: 52.6 %
Platelets: 281 10*3/uL (ref 140–400)
RBC: 4.77 10*6/uL (ref 3.80–5.10)
RDW: 12.2 % (ref 11.0–15.0)
Total Lymphocyte: 33.9 %
WBC: 5.3 10*3/uL (ref 3.8–10.8)

## 2021-07-07 LAB — LIPID PANEL
Cholesterol: 215 mg/dL — ABNORMAL HIGH (ref <200)
HDL: 70 mg/dL (ref >=50)
LDL Cholesterol (Calc): 128 mg/dL (calc) — ABNORMAL HIGH
Non-HDL Cholesterol (Calc): 145 mg/dL (calc) — ABNORMAL HIGH (ref <130)
Total CHOL/HDL Ratio: 3.1 (calc) (ref <5.0)
Triglycerides: 74 mg/dL (ref <150)

## 2021-07-07 LAB — COMPREHENSIVE METABOLIC PANEL
AG Ratio: 1.3 (calc) (ref 1.0–2.5)
ALT: 15 U/L (ref 6–29)
AST: 15 U/L (ref 10–35)
Albumin: 3.9 g/dL (ref 3.6–5.1)
Alkaline phosphatase (APISO): 57 U/L (ref 31–125)
BUN: 20 mg/dL (ref 7–25)
CO2: 21 mmol/L (ref 20–32)
Calcium: 9.1 mg/dL (ref 8.6–10.2)
Chloride: 106 mmol/L (ref 98–110)
Creat: 0.72 mg/dL (ref 0.50–0.99)
Globulin: 3 g/dL (calc) (ref 1.9–3.7)
Glucose, Bld: 98 mg/dL (ref 65–99)
Potassium: 3.9 mmol/L (ref 3.5–5.3)
Sodium: 138 mmol/L (ref 135–146)
Total Bilirubin: 0.4 mg/dL (ref 0.2–1.2)
Total Protein: 6.9 g/dL (ref 6.1–8.1)

## 2021-07-18 DIAGNOSIS — Z1231 Encounter for screening mammogram for malignant neoplasm of breast: Secondary | ICD-10-CM

## 2021-08-08 ENCOUNTER — Other Ambulatory Visit: Payer: Self-pay | Admitting: Nurse Practitioner

## 2021-08-08 DIAGNOSIS — Z1231 Encounter for screening mammogram for malignant neoplasm of breast: Secondary | ICD-10-CM

## 2021-08-12 ENCOUNTER — Ambulatory Visit
Admission: RE | Admit: 2021-08-12 | Discharge: 2021-08-12 | Disposition: A | Discharge status: Home / Self Care | Payer: 59 | Source: Ambulatory Visit

## 2021-08-12 DIAGNOSIS — Z1231 Encounter for screening mammogram for malignant neoplasm of breast: Secondary | ICD-10-CM

## 2021-08-16 ENCOUNTER — Other Ambulatory Visit: Payer: Self-pay | Admitting: Nurse Practitioner

## 2021-08-16 DIAGNOSIS — R928 Other abnormal and inconclusive findings on diagnostic imaging of breast: Secondary | ICD-10-CM

## 2021-09-02 ENCOUNTER — Ambulatory Visit
Admission: RE | Admit: 2021-09-02 | Discharge: 2021-09-02 | Disposition: A | Discharge status: Home / Self Care | Payer: 59 | Source: Ambulatory Visit | Attending: Nurse Practitioner | Admitting: Nurse Practitioner

## 2021-09-02 ENCOUNTER — Other Ambulatory Visit: Payer: Self-pay | Admitting: Nurse Practitioner

## 2021-09-02 DIAGNOSIS — R928 Other abnormal and inconclusive findings on diagnostic imaging of breast: Secondary | ICD-10-CM

## 2021-09-02 DIAGNOSIS — N6489 Other specified disorders of breast: Secondary | ICD-10-CM

## 2021-09-16 ENCOUNTER — Ambulatory Visit
Admission: RE | Admit: 2021-09-16 | Discharge: 2021-09-16 | Disposition: A | Discharge status: Home / Self Care | Payer: 59 | Source: Ambulatory Visit | Attending: Nurse Practitioner | Admitting: Nurse Practitioner

## 2021-09-16 DIAGNOSIS — N6489 Other specified disorders of breast: Secondary | ICD-10-CM

## 2022-04-19 ENCOUNTER — Telehealth: Payer: Self-pay

## 2022-04-19 ENCOUNTER — Other Ambulatory Visit: Payer: Self-pay

## 2022-04-19 DIAGNOSIS — Z1211 Encounter for screening for malignant neoplasm of colon: Secondary | ICD-10-CM

## 2022-04-19 NOTE — Telephone Encounter (Signed)
Yes, of course. Thank you

## 2022-04-19 NOTE — Telephone Encounter (Signed)
Called patient and left message that referral placed and forwarded to referral coordinator to send referral info via fax and arrange appt.

## 2022-04-19 NOTE — Telephone Encounter (Signed)
At 06/28/21 Brandi Puller, NP recommended colonoscopy. Referral was placed with [place] but patient said she was unable to reach that office to schedule.  She would like [place] to fax a referral to [facility] at [phone]. Her husband goes here and she would like to as well.  Ok to fax referral for screening colonoscopy there?

## 2022-04-19 NOTE — Telephone Encounter (Signed)
External ambulatory referral order placed for screening colonoscopy with [facility].     Will route to [place] to handle faxing referral info to this office.  Fax# [phone].

## 2022-05-31 NOTE — Telephone Encounter (Signed)
Referral scheduled for 07/14/2022.

## 2022-07-10 ENCOUNTER — Ambulatory Visit: Payer: No Typology Code available for payment source | Admitting: Nurse Practitioner

## 2022-07-18 NOTE — Progress Notes (Unsigned)
   Brandi Velez October 18, 1975 [id number]   History:  47 y.o. G1P1001 presents for annual exam without GYN complaints. Monthly cycles. Normal pap and mammogram history.   Gynecologic History Patient's last menstrual period was 07/10/2022 (exact date). Period Cycle (Days): 28 Period Duration (Days): 4 Period Pattern: Regular Menstrual Flow: Light Menstrual Control: Tampon, Maxi pad Dysmenorrhea: None Contraception/Family planning: none Sexually active: Yes  Health Maintenance Last Pap: 06/24/2020. Results were: Normal neg HPV, 5-year repeat Last mammogram: 08/12/2021. Results were: Possible right breast distortion. Benign biopsy later Last colonoscopy: 07/14/2022. Results were: Normal, 10-year recall Last Dexa: Not indicated  Past medical history, past surgical history, family history and social history were all reviewed and documented in the EPIC chart. 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   ROS:  A ROS was performed and pertinent positives and negatives are included.  Exam:  Vitals:   07/19/22 0754  BP: 102/66  Weight: 139 lb (63 kg)  Height: 5' 3.5" (1.613 m)     Body mass index is 24.24 kg/m.  General appearance:  Normal Thyroid:  Symmetrical, normal in size, without palpable masses or nodularity. Respiratory  Auscultation:  Clear without wheezing or rhonchi Cardiovascular  Auscultation:  Regular rate, without rubs, murmurs or gallops  Edema/varicosities:  Not grossly evident Abdominal  Soft,nontender, without masses, guarding or rebound.  Liver/spleen:  No organomegaly noted  Hernia:  None appreciated  Skin  Inspection:  Grossly normal Breasts: Examined lying and sitting.   Right: Without masses, retractions, nipple discharge or axillary adenopathy.   Left: Without masses, retractions, nipple discharge or axillary adenopathy. Gentitourinary   Inguinal/mons:  Normal without inguinal adenopathy  External genitalia:  Normal appearing vulva with no masses,  tenderness, or lesions  BUS/Urethra/Skene's glands:  Normal  Vagina:  Normal appearing with normal color and discharge, no lesions  Cervix:  Normal appearing without discharge or lesions  Uterus:  Normal in size, shape and contour.  Midline and mobile, nontender  Adnexa/parametria:     Rt: Normal in size, without masses or tenderness.   Lt: Normal in size, without masses or tenderness.  Anus and perineum: Normal  Digital rectal exam: Deferred  Patient informed chaperone available to be present for breast and pelvic exam. Patient has requested no chaperone to be present. Patient has been advised what will be completed during breast and pelvic exam.   Assessment/Plan:  47 y.o. G1P1001 for annual exam.   Well female exam with routine gynecological exam - Plan: CBC with Differential/Platelet, Comprehensive metabolic panel. Education provided on SBEs, importance of preventative screenings, current guidelines, high calcium diet, regular exercise, and multivitamin daily.   Hyperlipidemia, unspecified hyperlipidemia type - Plan: Lipid panel  Screening for cervical cancer - Normal Pap history. Will repeat at 5-year interval per guidelines.   Screening for breast cancer - Benign left breast biopsy last year. Continue annual screenings.  Normal breast exam today.  Screening for colon cancer - Colonoscopy 06/2022. Will repeat at GI's recommended interval.   Return in 1 year for annual.     Olivia Mackie DNP, 8:05 AM 07/19/2022

## 2022-07-19 ENCOUNTER — Ambulatory Visit (INDEPENDENT_AMBULATORY_CARE_PROVIDER_SITE_OTHER): Payer: 59 | Admitting: Nurse Practitioner

## 2022-07-19 ENCOUNTER — Encounter: Payer: Self-pay | Admitting: Nurse Practitioner

## 2022-07-19 VITALS — BP 102/66 | Ht 63.5 in | Wt 139.0 lb

## 2022-07-19 DIAGNOSIS — E785 Hyperlipidemia, unspecified: Secondary | ICD-10-CM

## 2022-07-19 DIAGNOSIS — Z8349 Family history of other endocrine, nutritional and metabolic diseases: Secondary | ICD-10-CM | POA: Diagnosis not present

## 2022-07-19 DIAGNOSIS — Z01419 Encounter for gynecological examination (general) (routine) without abnormal findings: Secondary | ICD-10-CM | POA: Diagnosis not present

## 2022-07-19 NOTE — Addendum Note (Signed)
Addended byWyline Beady on: 07/19/2022 08:27 AM   Modules accepted: Orders

## 2022-07-20 LAB — COMPREHENSIVE METABOLIC PANEL
AG Ratio: 1.6 (calc) (ref 1.0–2.5)
ALT: 16 U/L (ref 6–29)
AST: 14 U/L (ref 10–35)
Albumin: 4.2 g/dL (ref 3.6–5.1)
Alkaline phosphatase (APISO): 57 U/L (ref 31–125)
BUN: 17 mg/dL (ref 7–25)
CO2: 24 mmol/L (ref 20–32)
Calcium: 8.8 mg/dL (ref 8.6–10.2)
Chloride: 104 mmol/L (ref 98–110)
Creat: 0.62 mg/dL (ref 0.50–0.99)
Globulin: 2.7 g/dL (calc) (ref 1.9–3.7)
Glucose, Bld: 97 mg/dL (ref 65–99)
Potassium: 4 mmol/L (ref 3.5–5.3)
Sodium: 137 mmol/L (ref 135–146)
Total Bilirubin: 0.3 mg/dL (ref 0.2–1.2)
Total Protein: 6.9 g/dL (ref 6.1–8.1)

## 2022-07-20 LAB — TSH: TSH: 1.95 mIU/L

## 2022-07-20 LAB — CBC WITH DIFFERENTIAL/PLATELET
Absolute Monocytes: 498 cells/uL (ref 200–950)
Basophils Absolute: 39 cells/uL (ref 0–200)
Basophils Relative: 0.7 %
Eosinophils Absolute: 291 cells/uL (ref 15–500)
Eosinophils Relative: 5.2 %
HCT: 41.6 % (ref 35.0–45.0)
Hemoglobin: 13.7 g/dL (ref 11.7–15.5)
Lymphs Abs: 1820 cells/uL (ref 850–3900)
MCH: 28 pg (ref 27.0–33.0)
MCHC: 32.9 g/dL (ref 32.0–36.0)
MCV: 84.9 fL (ref 80.0–100.0)
MPV: 10.4 fL (ref 7.5–12.5)
Monocytes Relative: 8.9 %
Neutro Abs: 2951 cells/uL (ref 1500–7800)
Neutrophils Relative %: 52.7 %
Platelets: 294 10*3/uL (ref 140–400)
RBC: 4.9 10*6/uL (ref 3.80–5.10)
RDW: 12.3 % (ref 11.0–15.0)
Total Lymphocyte: 32.5 %
WBC: 5.6 10*3/uL (ref 3.8–10.8)

## 2022-07-20 LAB — LIPID PANEL
Cholesterol: 236 mg/dL — ABNORMAL HIGH (ref <200)
HDL: 79 mg/dL (ref >=50)
LDL Cholesterol (Calc): 139 mg/dL (calc) — ABNORMAL HIGH
Non-HDL Cholesterol (Calc): 157 mg/dL (calc) — ABNORMAL HIGH (ref <130)
Total CHOL/HDL Ratio: 3 (calc) (ref <5.0)
Triglycerides: 81 mg/dL (ref <150)

## 2022-08-22 ENCOUNTER — Other Ambulatory Visit: Payer: Self-pay | Admitting: Nurse Practitioner

## 2022-08-22 DIAGNOSIS — R921 Mammographic calcification found on diagnostic imaging of breast: Secondary | ICD-10-CM

## 2022-10-09 IMAGING — MG MM DIGITAL SCREENING BILAT W/ TOMO AND CAD
8 series · 9 of 24 positions shown · non-contrast
Comparison: Previous exam(s).

CLINICAL DATA: Screening.

EXAM:
DIGITAL SCREENING BILATERAL MAMMOGRAM WITH TOMOSYNTHESIS AND CAD
TECHNIQUE: Bilateral screening digital craniocaudal and mediolateral oblique
mammograms were obtained. Bilateral screening digital breast
tomosynthesis was performed. The images were evaluated with
computer-aided detection.

[L MLO synth-2D]
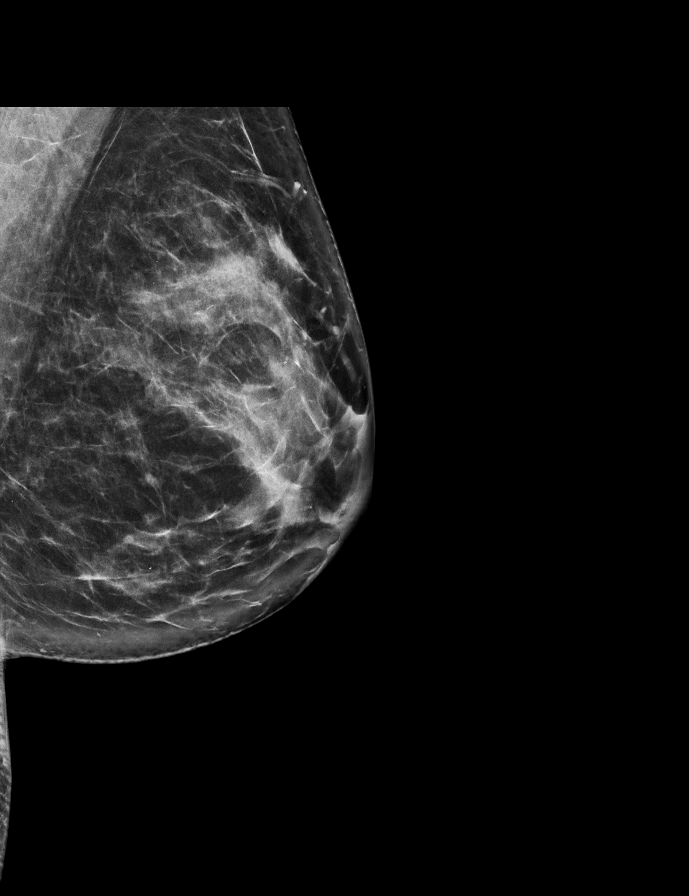

[R MLO synth-2D]
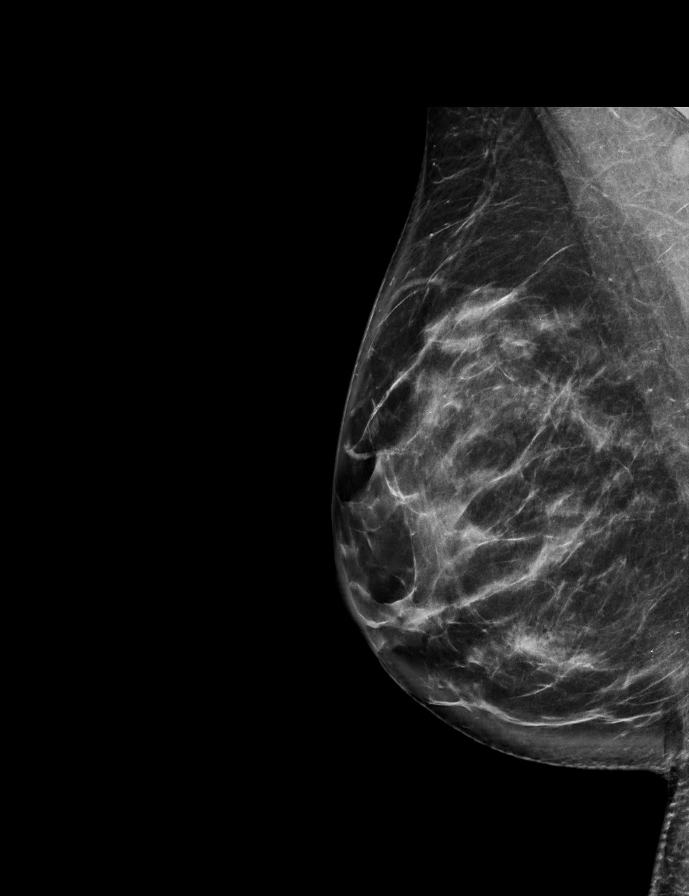

[R CC synth-2D]
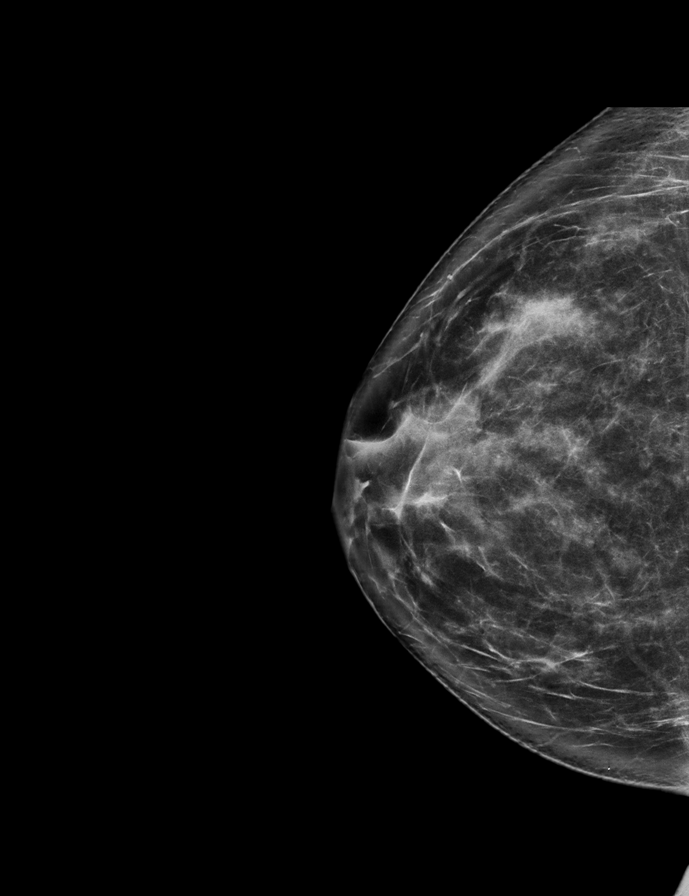

[L CC synth-2D]
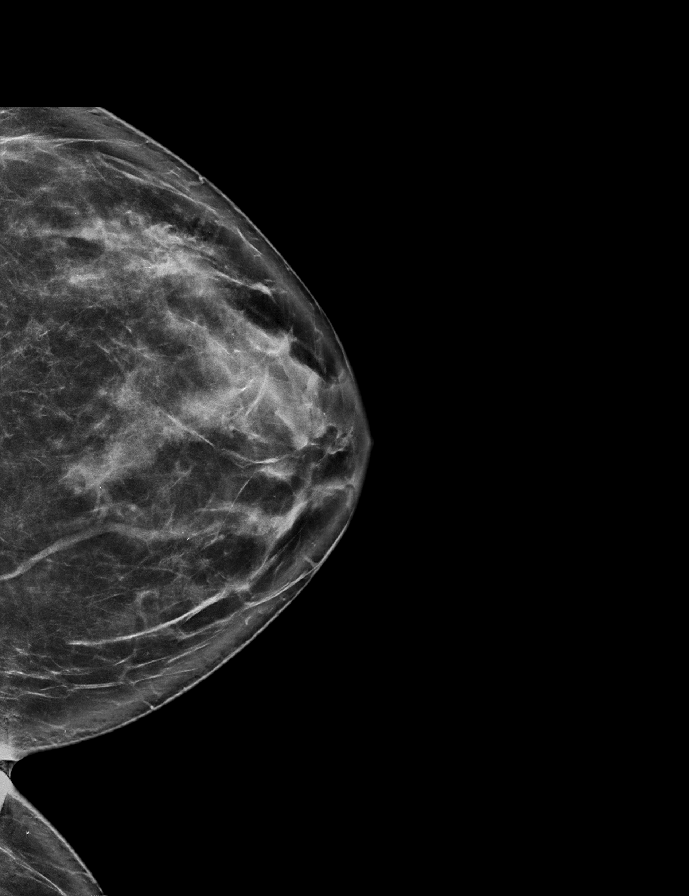

[R MLO tomo · 2 of 83 frames shown]
[frame 27/83]
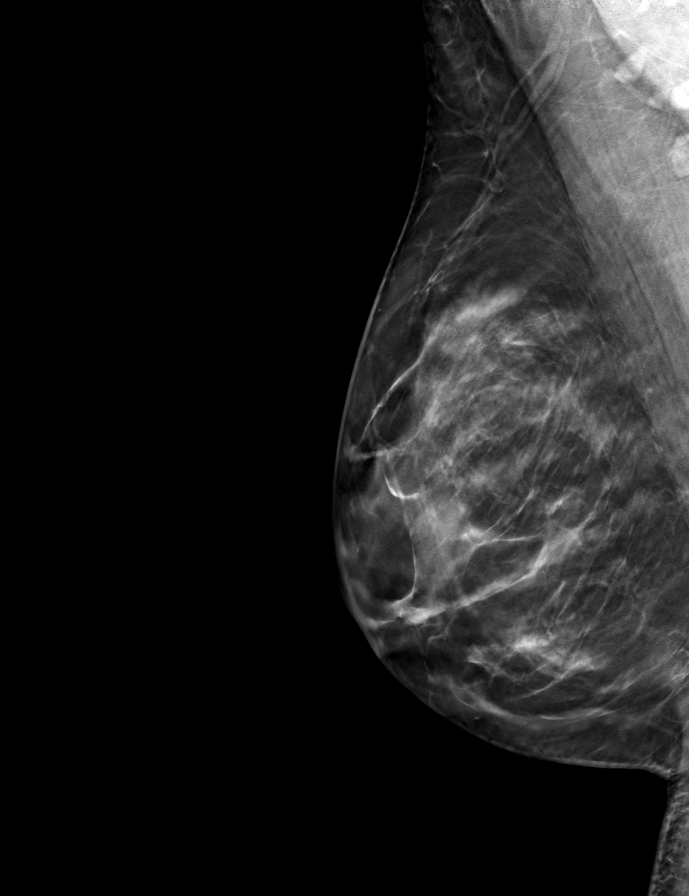
[frame 42/83]
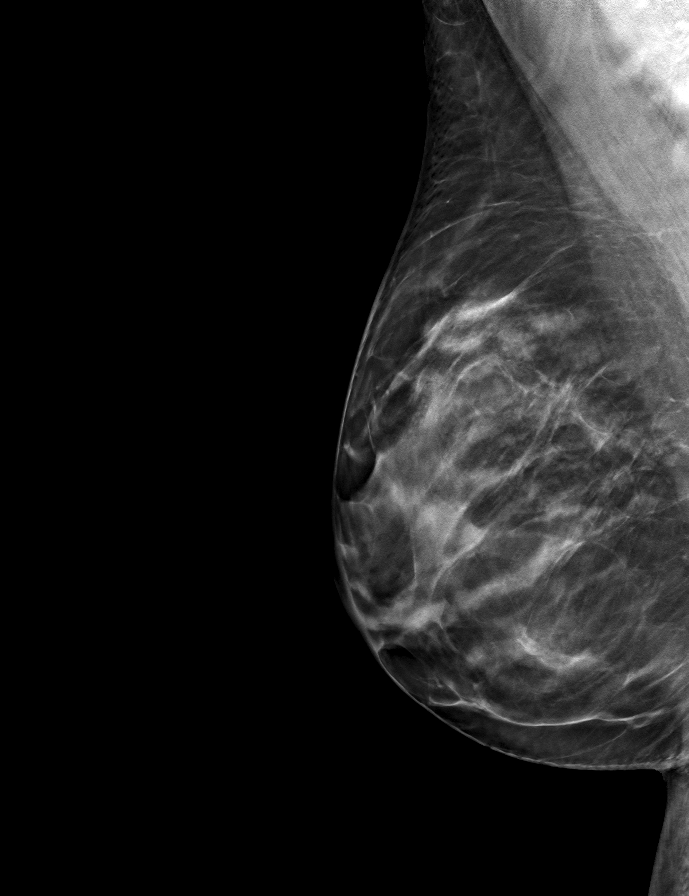

[L CC tomo · tomo slice 39/76.0]
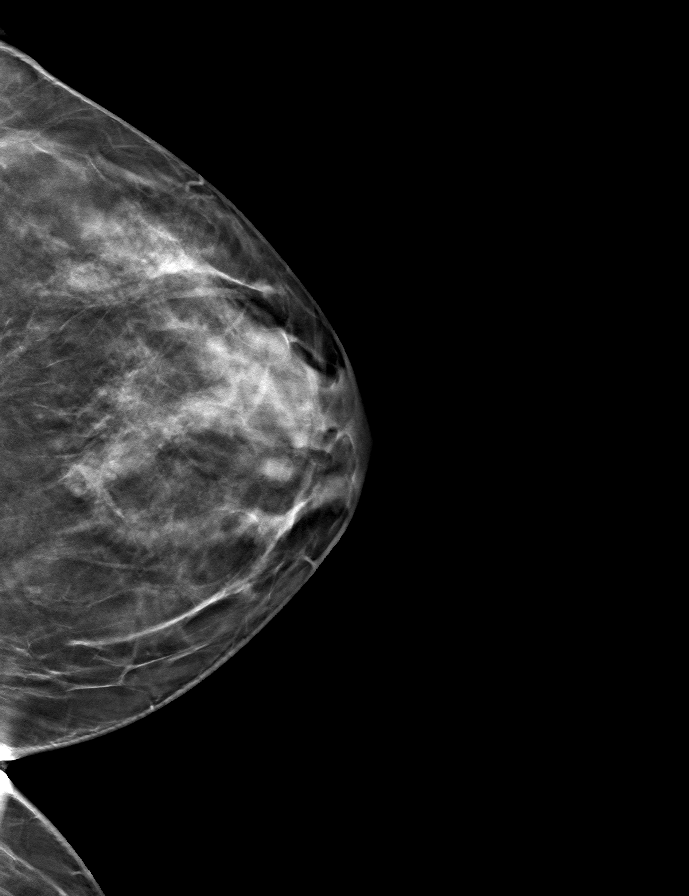

[R CC tomo · tomo slice 39/78.0]
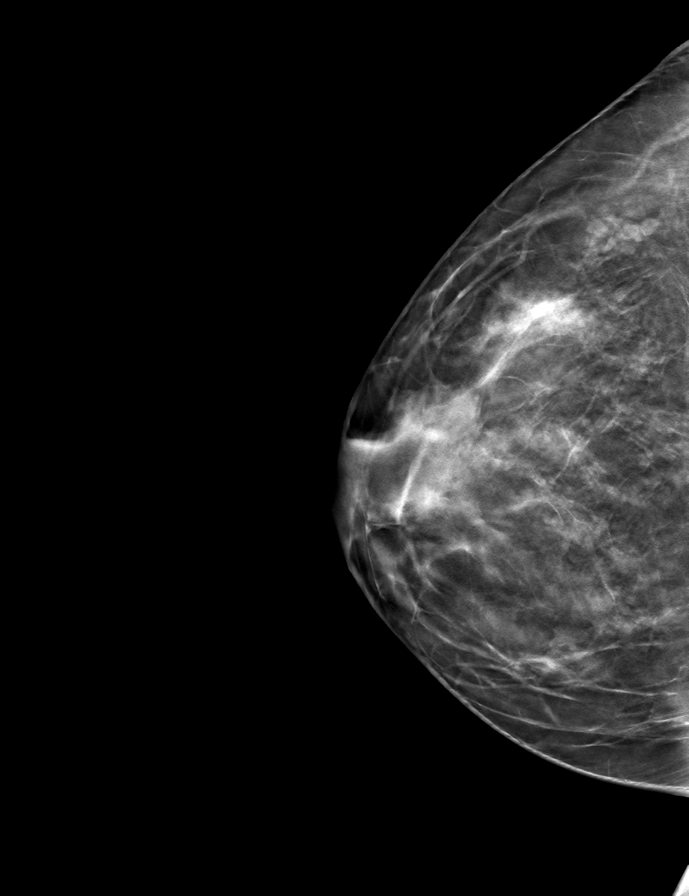

[L MLO tomo · tomo slice 39/76.0]
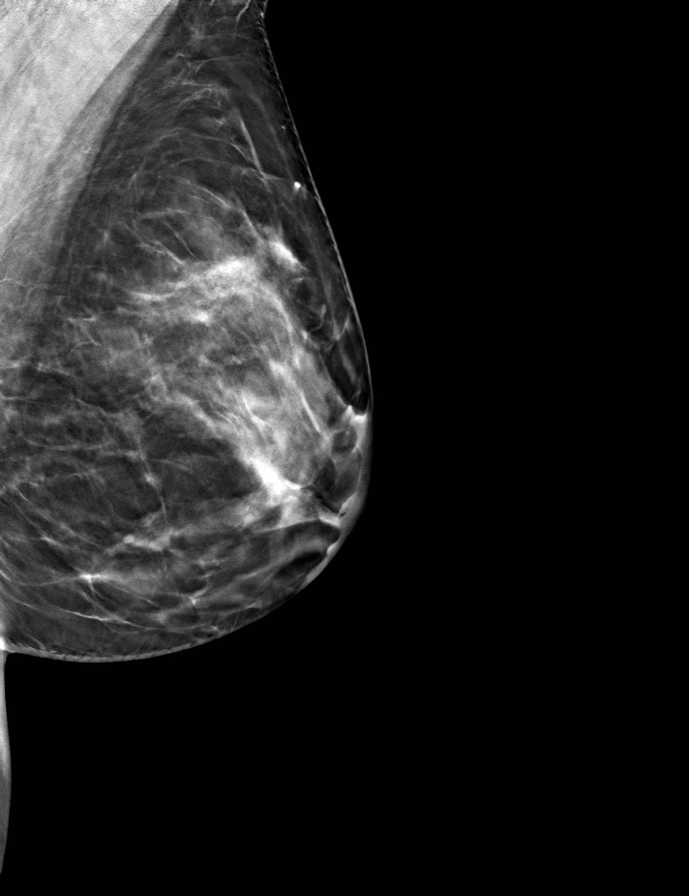

[9 of 24 positions shown; findings below may reference images not displayed]

ACR Breast Density Category c: The breast tissue is heterogeneously
dense, which may obscure small masses.
FINDINGS: In the right breast, possible distortion warrants further
evaluation. In the left breast, no findings suspicious for
malignancy.
IMPRESSION: Further evaluation is suggested for possible distortion in the right
breast.

RECOMMENDATION:
Diagnostic mammogram and possibly ultrasound of the right breast.
(Code:56-2-XX1)

The patient will be contacted regarding the findings, and additional
imaging will be scheduled.

BI-RADS CATEGORY  0: Incomplete. Need additional imaging evaluation
and/or prior mammograms for comparison.

## 2022-10-19 ENCOUNTER — Ambulatory Visit
Admission: RE | Admit: 2022-10-19 | Discharge: 2022-10-19 | Disposition: A | Discharge status: Home / Self Care | Payer: 59 | Source: Ambulatory Visit | Attending: Nurse Practitioner | Admitting: Nurse Practitioner

## 2022-10-19 DIAGNOSIS — R921 Mammographic calcification found on diagnostic imaging of breast: Secondary | ICD-10-CM

## 2023-07-30 ENCOUNTER — Ambulatory Visit: Admitting: Nurse Practitioner

## 2023-11-05 NOTE — Progress Notes (Unsigned)
   Brandi Velez 1975-04-29 [id number]   History:  48 y.o. G1P1001 presents for annual exam without GYN complaints. Monthly cycles. Normal pap and mammogram history. Benign right breast biopsy 08/2021.  Gynecologic History Patient's last menstrual period was 10/14/2023 (exact date). Period Duration (Days): 3 Period Pattern: Regular Menstrual Flow: Light Menstrual Control: Maxi pad, Tampon Dysmenorrhea: None Contraception/Family planning: none Sexually active: Yes  Health Maintenance Last Pap: 06/24/2020. Results were: Normal neg HPV Last mammogram: 10/19/2022. Results were: Normal Last colonoscopy: 07/14/2022. Results were: Normal, 10-year recall Last Dexa: Not indicated     11/06/2023    8:02 AM  Depression screen PHQ 2/9  Decreased Interest 0  Down, Depressed, Hopeless 0  PHQ - 2 Score 0     Past medical history, past surgical history, family history and social history were all reviewed and documented in the EPIC chart. 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   ROS:  A ROS was performed and pertinent positives and negatives are included.  Exam:  Vitals:   11/06/23 0752  BP: 116/70  Pulse: 71  SpO2: 99%  Weight: 137 lb (62.1 kg)  Height: 5' 3.25 (1.607 m)      Body mass index is 24.08 kg/m.  General appearance:  Normal Thyroid:  Symmetrical, normal in size, without palpable masses or nodularity. Respiratory  Auscultation:  Clear without wheezing or rhonchi Cardiovascular  Auscultation:  Regular rate, without rubs, murmurs or gallops  Edema/varicosities:  Not grossly evident Abdominal  Soft,nontender, without masses, guarding or rebound.  Liver/spleen:  No organomegaly noted  Hernia:  None appreciated  Skin  Inspection:  Grossly normal Breasts: Examined lying and sitting.   Right: Without masses, retractions, nipple discharge or axillary adenopathy.   Left: Without masses, retractions, nipple discharge or axillary adenopathy. Pelvic: External genitalia:  no  lesions              Urethra:  normal appearing urethra with no masses, tenderness or lesions              Bartholins and Skenes: normal                 Vagina: normal appearing vagina with normal color and discharge, no lesions              Cervix: no lesions Bimanual Exam:  Uterus:  no masses or tenderness              Adnexa: no mass, fullness, tenderness              Rectovaginal: Deferred              Anus:  normal, no lesions  Dereck Keas, CMA present as chaperone.   Assessment/Plan:  48 y.o. G1P1001 for annual exam.   Well female exam with routine gynecological exam - Plan: CBC with Differential/Platelet, Comprehensive metabolic panel with GFR, TSH. Education provided on SBEs, importance of preventative screenings, current guidelines, high calcium diet, regular exercise, and multivitamin daily.   Hyperlipidemia, unspecified hyperlipidemia type - Plan: Lipid panel  Screening for cervical cancer - Normal Pap history. Will repeat at 5-year interval per guidelines.   Screening for breast cancer - Benign left breast biopsy 2023. Continue annual screenings.  Normal breast exam today.  Screening for colon cancer - Colonoscopy 06/2022. Will repeat at GI's recommended interval.   Return in about 1 year (around 11/05/2024) for Annual.     Brandi DELENA Shutter DNP, 8:17 AM 11/06/2023

## 2023-11-06 ENCOUNTER — Encounter: Payer: Self-pay | Admitting: Nurse Practitioner

## 2023-11-06 ENCOUNTER — Ambulatory Visit (INDEPENDENT_AMBULATORY_CARE_PROVIDER_SITE_OTHER): Admitting: Nurse Practitioner

## 2023-11-06 VITALS — BP 116/70 | HR 71 | Ht 63.25 in | Wt 137.0 lb

## 2023-11-06 DIAGNOSIS — Z1331 Encounter for screening for depression: Secondary | ICD-10-CM | POA: Diagnosis not present

## 2023-11-06 DIAGNOSIS — E785 Hyperlipidemia, unspecified: Secondary | ICD-10-CM | POA: Diagnosis not present

## 2023-11-06 DIAGNOSIS — Z01419 Encounter for gynecological examination (general) (routine) without abnormal findings: Secondary | ICD-10-CM

## 2023-11-06 LAB — COMPREHENSIVE METABOLIC PANEL WITH GFR
AG Ratio: 1.5 (calc) (ref 1.0–2.5)
ALT: 17 U/L (ref 6–29)
AST: 14 U/L (ref 10–35)
Albumin: 4.1 g/dL (ref 3.6–5.1)
Alkaline phosphatase (APISO): 51 U/L (ref 31–125)
BUN: 16 mg/dL (ref 7–25)
CO2: 26 mmol/L (ref 20–32)
Calcium: 9 mg/dL (ref 8.6–10.2)
Chloride: 105 mmol/L (ref 98–110)
Creat: 0.68 mg/dL (ref 0.50–0.99)
Globulin: 2.8 g/dL (ref 1.9–3.7)
Glucose, Bld: 96 mg/dL (ref 65–99)
Potassium: 3.9 mmol/L (ref 3.5–5.3)
Sodium: 138 mmol/L (ref 135–146)
Total Bilirubin: 0.5 mg/dL (ref 0.2–1.2)
Total Protein: 6.9 g/dL (ref 6.1–8.1)
eGFR: 107 mL/min/1.73m2 (ref >=60)

## 2023-11-06 LAB — CBC WITH DIFFERENTIAL/PLATELET
Absolute Lymphocytes: 1488 {cells}/uL (ref 850–3900)
Absolute Monocytes: 381 {cells}/uL (ref 200–950)
Basophils Absolute: 49 {cells}/uL (ref 0–200)
Basophils Relative: 1.2 %
Eosinophils Absolute: 262 {cells}/uL (ref 15–500)
Eosinophils Relative: 6.4 %
HCT: 39.6 % (ref 35.0–45.0)
Hemoglobin: 12.9 g/dL (ref 11.7–15.5)
MCH: 27.9 pg (ref 27.0–33.0)
MCHC: 32.6 g/dL (ref 32.0–36.0)
MCV: 85.7 fL (ref 80.0–100.0)
MPV: 10.2 fL (ref 7.5–12.5)
Monocytes Relative: 9.3 %
Neutro Abs: 1919 {cells}/uL (ref 1500–7800)
Neutrophils Relative %: 46.8 %
Platelets: 261 Thousand/uL (ref 140–400)
RBC: 4.62 Million/uL (ref 3.80–5.10)
RDW: 12.2 % (ref 11.0–15.0)
Total Lymphocyte: 36.3 %
WBC: 4.1 Thousand/uL (ref 3.8–10.8)

## 2023-11-06 LAB — LIPID PANEL
Cholesterol: 223 mg/dL — ABNORMAL HIGH (ref <200)
HDL: 79 mg/dL (ref >=50)
LDL Cholesterol (Calc): 130 mg/dL — ABNORMAL HIGH
Non-HDL Cholesterol (Calc): 144 mg/dL — ABNORMAL HIGH (ref <130)
Total CHOL/HDL Ratio: 2.8 (calc) (ref <5.0)
Triglycerides: 53 mg/dL (ref <150)

## 2023-11-06 LAB — TSH: TSH: 1.34 m[IU]/L

## 2023-11-07 ENCOUNTER — Ambulatory Visit: Payer: Self-pay | Admitting: Nurse Practitioner

## 2024-03-05 ENCOUNTER — Other Ambulatory Visit: Payer: Self-pay | Admitting: Nurse Practitioner

## 2024-03-05 DIAGNOSIS — Z1231 Encounter for screening mammogram for malignant neoplasm of breast: Secondary | ICD-10-CM

## 2024-04-03 ENCOUNTER — Ambulatory Visit

## 2024-04-03 DIAGNOSIS — Z1231 Encounter for screening mammogram for malignant neoplasm of breast: Secondary | ICD-10-CM

## 2024-04-10 ENCOUNTER — Ambulatory Visit (HOSPITAL_BASED_OUTPATIENT_CLINIC_OR_DEPARTMENT_OTHER)

## 2024-11-11 ENCOUNTER — Ambulatory Visit: Admitting: Nurse Practitioner
